# Patient Record
Sex: Male | Born: 1985 | Race: Black or African American | Hispanic: No | Marital: Married | Smoking: Never smoker
Health system: Southern US, Community
[De-identification: ages and names within clinical notes are randomized; demographics above are authoritative.]

## PROBLEM LIST (undated history)

## (undated) DIAGNOSIS — R51 Headache: Secondary | ICD-10-CM

## (undated) DIAGNOSIS — G8929 Other chronic pain: Secondary | ICD-10-CM

## (undated) DIAGNOSIS — R519 Headache, unspecified: Secondary | ICD-10-CM

---
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
## 2004-12-28 ENCOUNTER — Emergency Department (HOSPITAL_COMMUNITY): Admission: EM | Admit: 2004-12-28 | Discharge: 2004-12-28 | Payer: Self-pay | Admitting: Emergency Medicine

## 2005-01-13 ENCOUNTER — Emergency Department (HOSPITAL_COMMUNITY): Admission: EM | Admit: 2005-01-13 | Discharge: 2005-01-13 | Payer: Self-pay | Admitting: Emergency Medicine

## 2005-01-17 ENCOUNTER — Emergency Department (HOSPITAL_COMMUNITY): Admission: EM | Admit: 2005-01-17 | Discharge: 2005-01-17 | Payer: Self-pay | Admitting: Emergency Medicine

## 2005-01-24 ENCOUNTER — Emergency Department: Payer: Self-pay | Admitting: Emergency Medicine

## 2005-02-25 ENCOUNTER — Emergency Department (HOSPITAL_COMMUNITY): Admission: EM | Admit: 2005-02-25 | Discharge: 2005-02-25 | Payer: Self-pay | Admitting: Emergency Medicine

## 2005-07-19 ENCOUNTER — Emergency Department (HOSPITAL_COMMUNITY): Admission: EM | Admit: 2005-07-19 | Discharge: 2005-07-19 | Payer: Self-pay | Admitting: Emergency Medicine

## 2006-01-26 ENCOUNTER — Emergency Department (HOSPITAL_COMMUNITY): Admission: EM | Admit: 2006-01-26 | Discharge: 2006-01-26 | Payer: Self-pay | Admitting: Emergency Medicine

## 2006-03-22 ENCOUNTER — Emergency Department (HOSPITAL_COMMUNITY): Admission: EM | Admit: 2006-03-22 | Discharge: 2006-03-22 | Payer: Self-pay | Admitting: Emergency Medicine

## 2006-03-23 ENCOUNTER — Emergency Department (HOSPITAL_COMMUNITY): Admission: EM | Admit: 2006-03-23 | Discharge: 2006-03-23 | Payer: Self-pay | Admitting: Emergency Medicine

## 2006-08-13 ENCOUNTER — Emergency Department (HOSPITAL_COMMUNITY): Admission: EM | Admit: 2006-08-13 | Discharge: 2006-08-13 | Payer: Self-pay | Admitting: Emergency Medicine

## 2006-11-15 ENCOUNTER — Emergency Department (HOSPITAL_COMMUNITY): Admission: EM | Admit: 2006-11-15 | Discharge: 2006-11-15 | Payer: Self-pay | Admitting: Emergency Medicine

## 2007-05-21 ENCOUNTER — Emergency Department (HOSPITAL_COMMUNITY): Admission: EM | Admit: 2007-05-21 | Discharge: 2007-05-21 | Payer: Self-pay | Admitting: Emergency Medicine

## 2007-05-22 ENCOUNTER — Emergency Department (HOSPITAL_COMMUNITY): Admission: EM | Admit: 2007-05-22 | Discharge: 2007-05-23 | Payer: Self-pay | Admitting: Emergency Medicine

## 2007-05-22 IMAGING — CR DG WRIST COMPLETE 3+V*L*
3 series · 3 of 3 positions shown · non-contrast
Comparison: No prior studies.

CLINICAL DATA: Wrist was crushed between roller.  Pain is radial and anterior.
 LEFT WRIST - 4 VIEW:

[view not recorded (1 of 3)]
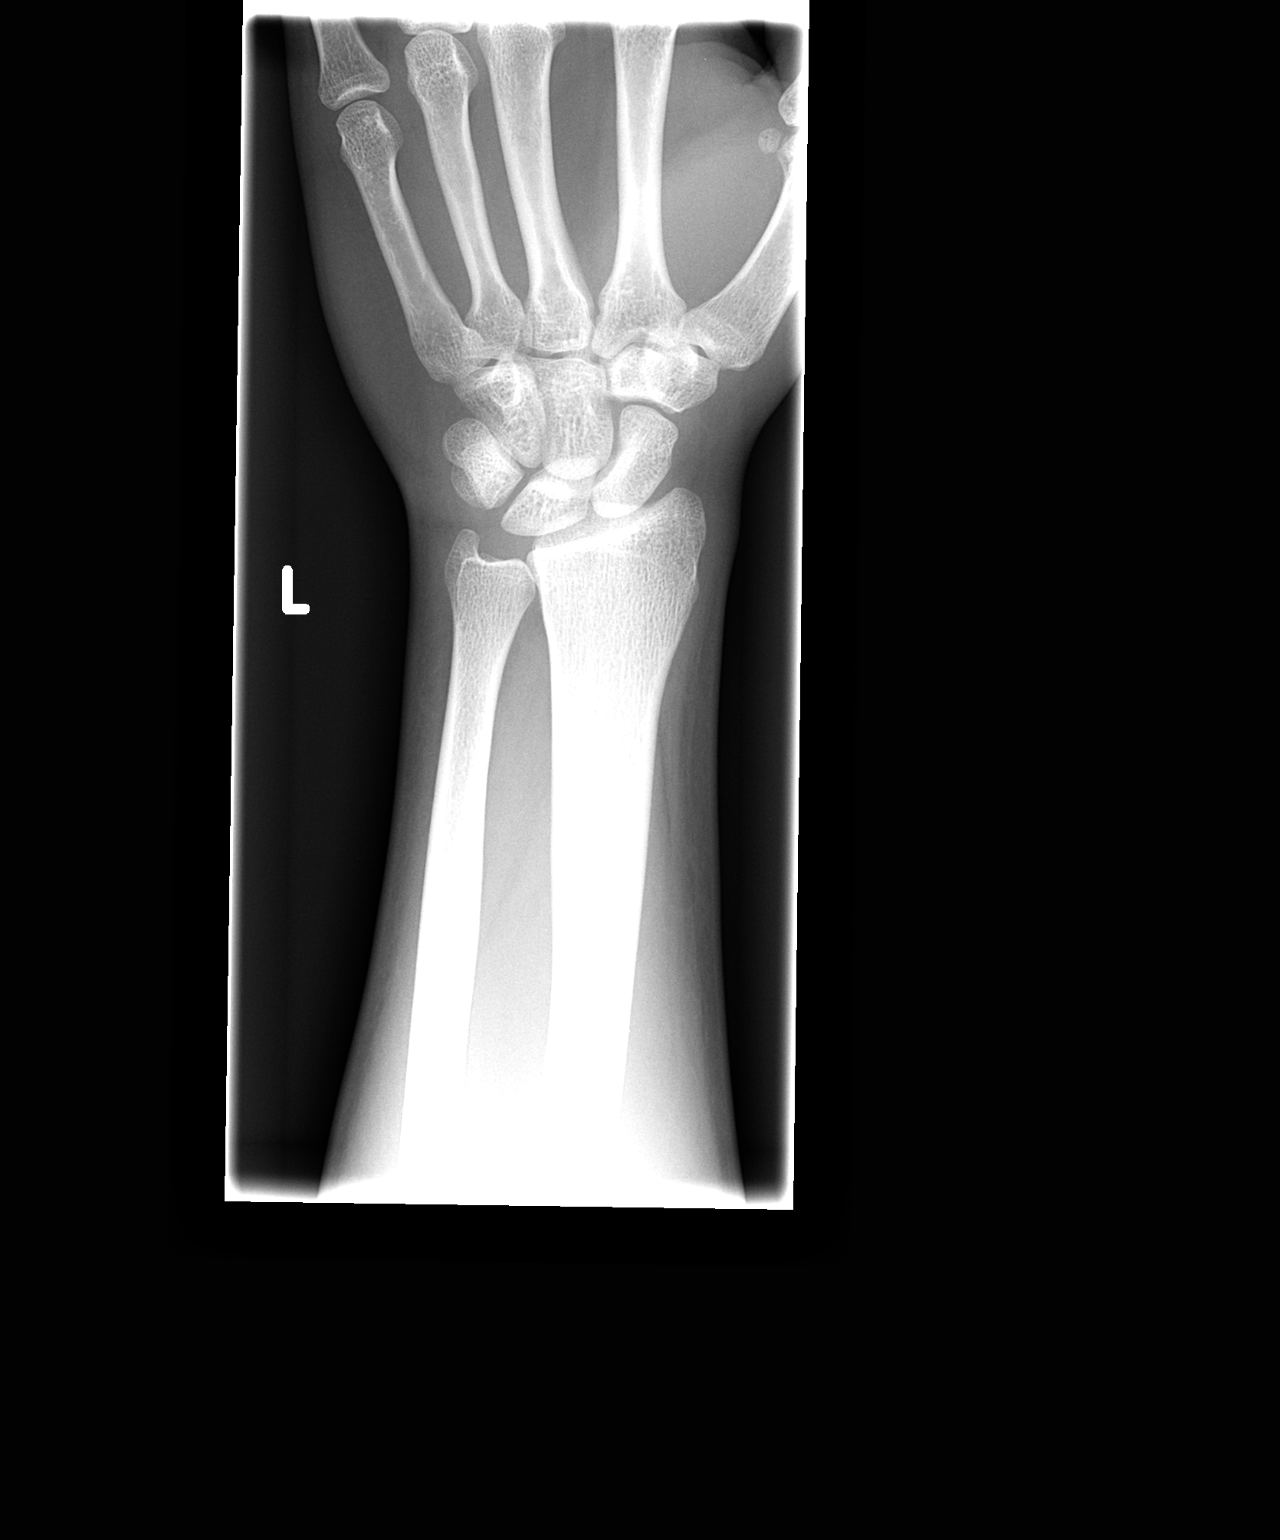

[view not recorded (2 of 3)]
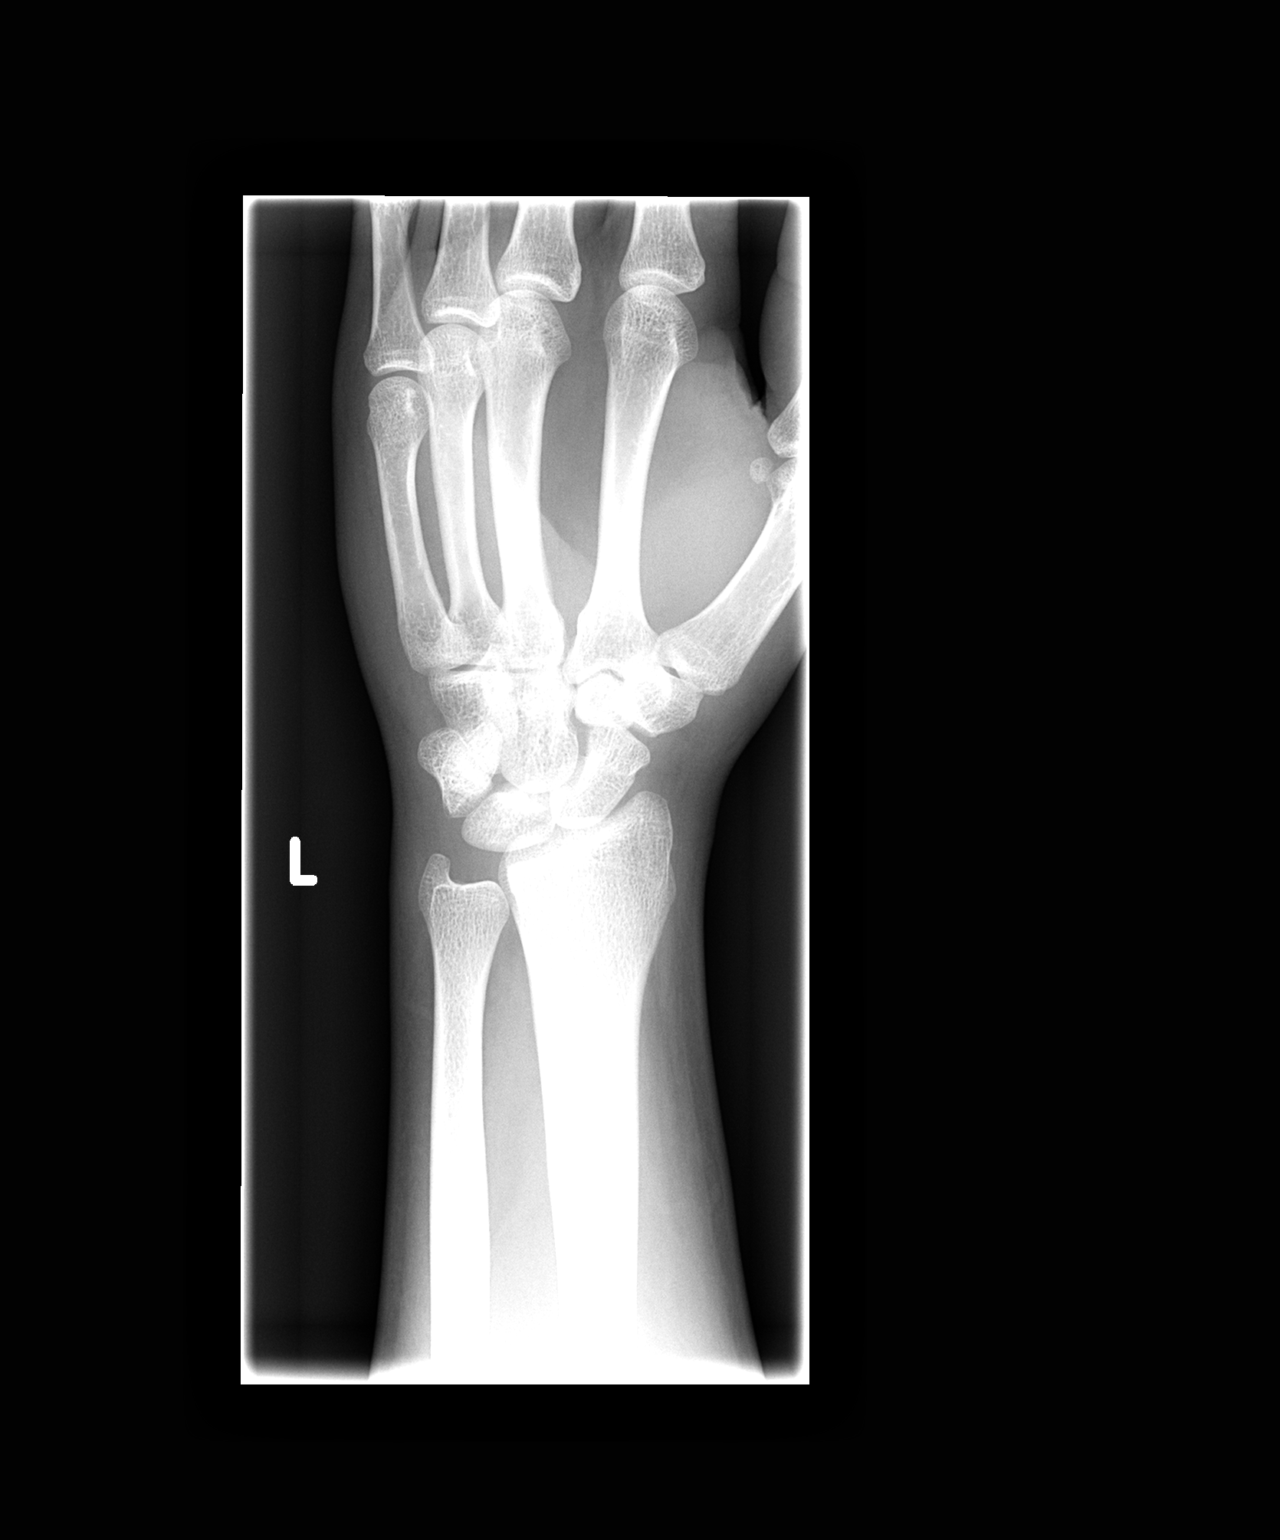

[view not recorded (3 of 3)]
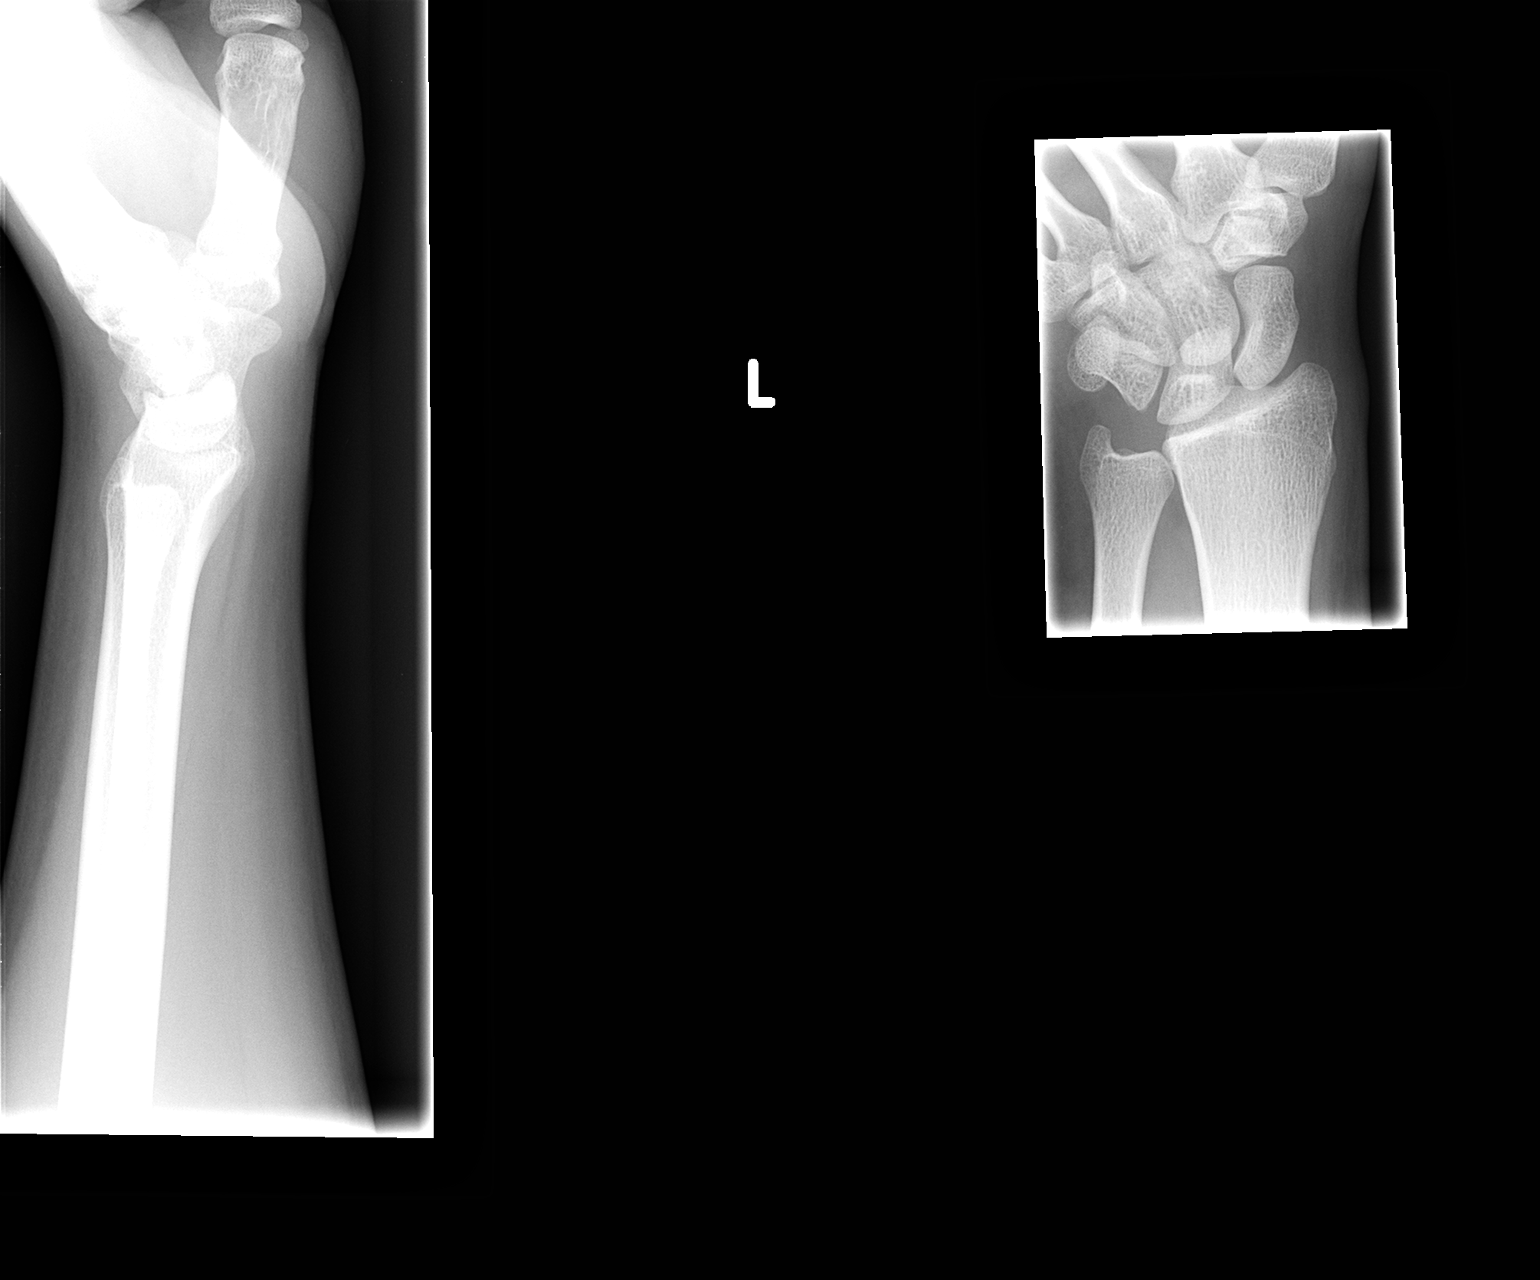

[3 of 3 positions shown; findings below may reference images not displayed]

FINDINGS: There is no evidence of fracture or dislocation.  There is no evidence of arthropathy or other focal bone abnormality.  Soft tissues are unremarkable.
IMPRESSION: Negative.

## 2011-03-12 ENCOUNTER — Encounter: Payer: Self-pay | Admitting: Emergency Medicine

## 2011-03-12 ENCOUNTER — Emergency Department (HOSPITAL_COMMUNITY)
Admission: EM | Admit: 2011-03-12 | Discharge: 2011-03-12 | Disposition: A | Discharge status: Home / Self Care | Payer: Self-pay | Attending: Emergency Medicine | Admitting: Emergency Medicine

## 2011-03-12 DIAGNOSIS — R6889 Other general symptoms and signs: Secondary | ICD-10-CM | POA: Insufficient documentation

## 2011-03-12 DIAGNOSIS — K0889 Other specified disorders of teeth and supporting structures: Secondary | ICD-10-CM

## 2011-03-12 DIAGNOSIS — R220 Localized swelling, mass and lump, head: Secondary | ICD-10-CM | POA: Insufficient documentation

## 2011-03-12 DIAGNOSIS — K089 Disorder of teeth and supporting structures, unspecified: Secondary | ICD-10-CM | POA: Insufficient documentation

## 2011-03-12 DIAGNOSIS — R221 Localized swelling, mass and lump, neck: Secondary | ICD-10-CM | POA: Insufficient documentation

## 2011-03-12 DIAGNOSIS — R51 Headache: Secondary | ICD-10-CM | POA: Insufficient documentation

## 2011-03-12 DIAGNOSIS — J3489 Other specified disorders of nose and nasal sinuses: Secondary | ICD-10-CM | POA: Insufficient documentation

## 2011-03-12 DIAGNOSIS — J0100 Acute maxillary sinusitis, unspecified: Secondary | ICD-10-CM

## 2011-03-12 DIAGNOSIS — R0982 Postnasal drip: Secondary | ICD-10-CM | POA: Insufficient documentation

## 2011-03-12 MED ORDER — ACETAMINOPHEN-CODEINE #3 300-30 MG PO TABS
2.0000 | ORAL_TABLET | Freq: Once | ORAL | Status: AC
  Administered 2011-03-12: 2 via ORAL
  Filled 2011-03-12: qty 2

## 2011-03-12 MED ORDER — AMOXICILLIN-POT CLAVULANATE 500-125 MG PO TABS: 1.0000 | ORAL_TABLET | Freq: Three times a day (TID) | ORAL | Status: AC

## 2011-03-12 MED ORDER — ACETAMINOPHEN-CODEINE #3 300-30 MG PO TABS: 1.0000 | ORAL_TABLET | Freq: Four times a day (QID) | ORAL | Status: AC | PRN

## 2011-03-12 NOTE — ED Notes (Signed)
H/a full sinus dental pain x 12 weeks taking otc meds not helping

## 2011-03-12 NOTE — Discharge Instructions (Signed)
 Dental Pain A tooth ache may be caused by cavities (tooth decay). Cavities expose the nerve of the tooth to air and hot or cold temperatures. It may come from an infection or abscess (also called a boil or furuncle) around your tooth. It is also often caused by dental caries (tooth decay). This causes the pain you are having. DIAGNOSIS  Your caregiver can diagnose this problem by exam. TREATMENT   If caused by an infection, it may be treated with medications which kill germs (antibiotics) and pain medications as prescribed by your caregiver. Take medications as directed.   Only take over-the-counter or prescription medicines for pain, discomfort, or fever as directed by your caregiver.   Whether the tooth ache today is caused by infection or dental disease, you should see your dentist as soon as possible for further care.  SEEK MEDICAL CARE IF: The exam and treatment you received today has been provided on an emergency basis only. This is not a substitute for complete medical or dental care. If your problem worsens or new problems (symptoms) appear, and you are unable to meet with your dentist, call or return to this location. SEEK IMMEDIATE MEDICAL CARE IF:   You have a fever.   You develop redness and swelling of your face, jaw, or neck.   You are unable to open your mouth.   You have severe pain uncontrolled by pain medicine.  MAKE SURE YOU:   Understand these instructions.   Will watch your condition.   Will get help right away if you are not doing well or get worse.  Document Released: 02/19/2005 Document Revised: 11/01/2010 Document Reviewed: 10/08/2007 [facility] Patient Information 2012 [place], [place].   Sinusitis Sinuses are air pockets within the bones of your face. The growth of bacteria within a sinus leads to infection. The infection prevents the sinuses from draining. This infection is called sinusitis. SYMPTOMS  There will be different areas of pain depending on  which sinuses have become infected.  The maxillary sinuses often produce pain beneath the eyes.   Frontal sinusitis may cause pain in the middle of the forehead and above the eyes.  Other problems (symptoms) include:  Toothaches.   Colored, pus-like (purulent) drainage from the nose.   Swelling, warmth, and tenderness over the sinus areas may be signs of infection.  TREATMENT  Sinusitis is most often determined by an exam.X-rays may be taken. If x-rays have been taken, make sure you obtain your results or find out how you are to obtain them. Your caregiver may give you medications (antibiotics). These are medications that will help kill the bacteria causing the infection. You may also be given a medication (decongestant) that helps to reduce sinus swelling.  HOME CARE INSTRUCTIONS   Only take over-the-counter or prescription medicines for pain, discomfort, or fever as directed by your caregiver.   Drink extra fluids. Fluids help thin the mucus so your sinuses can drain more easily.   Applying either moist heat or ice packs to the sinus areas may help relieve discomfort.   Use saline nasal sprays to help moisten your sinuses. The sprays can be found at your local drugstore.  SEEK IMMEDIATE MEDICAL CARE IF:  You have a fever.   You have increasing pain, severe headaches, or toothache.   You have nausea, vomiting, or drowsiness.   You develop unusual swelling around the face or trouble seeing.  MAKE SURE YOU:   Understand these instructions.   Will watch your condition.  Will get help right away if you are not doing well or get worse.  Document Released: 02/19/2005 Document Revised: 11/01/2010 Document Reviewed: 09/18/2006 [facility] Patient Information 2012 [place], [place].

## 2011-03-12 NOTE — ED Provider Notes (Signed)
History     CSN: [id number]  Arrival date & time 03/12/11  1253   First MD Initiated Contact with Patient 03/12/11 1631      Chief Complaint  Patient presents with  . Dental Pain    (Consider location/radiation/quality/duration/timing/severity/associated sxs/prior treatment) HPI Comments: Patient reports that he's had some dental pain in his left upper posterior jaw. This is been off and on for a while. He attributes this to his wisdom teeth. He had his lower wisdom teeth removed while he was in the [employer]. Currently he does not have the finances nor dental insurance to see a dentist. He reports it does hurt to chew or with palpation or pressure August teeth. Patient denies smoking. He also complains of a left-sided facial and head pain that began one to 2 days ago. He is taking Advil with some mild transient relief, however the pain to his coming back. He does have a lot of sinus congestion with discharge from his left nares and drainage in the back of his throat. He denies fevers, chills, cough. He denies rash or stiff neck. No visual problems.  Patient is a 26 y.o. male presenting with tooth pain. The history is provided by the patient.  Dental PainThe primary symptoms include headaches. Primary symptoms do not include fever, shortness of breath, sore throat or cough.  The headache is not associated with weakness.  Additional symptoms do not include: facial swelling and ear pain.    History reviewed. No pertinent past medical history.  History reviewed. No pertinent past surgical history.  No family history on file.  History  Substance Use Topics  . Smoking status: Never Smoker   . Smokeless tobacco: Not on file  . Alcohol Use: Yes      Review of Systems  Constitutional: Negative for fever and chills.  HENT: Positive for congestion, rhinorrhea, sneezing, dental problem, postnasal drip and sinus pressure. Negative for ear pain, sore throat, facial swelling, mouth sores and  neck pain.   Respiratory: Negative for cough, shortness of breath and wheezing.   Neurological: Positive for headaches. Negative for speech difficulty, weakness and numbness.    Allergies  Review of patient's allergies indicates no known allergies.  Home Medications   Current Outpatient Rx  Name Route Sig Dispense Refill  . ALBUTEROL SULFATE HFA 108 (90 BASE) MCG/ACT IN AERS Inhalation Inhale 2 puffs into the lungs every 6 (six) hours as needed. For shortness of breath     . IBUPROFEN 200 MG PO TABS Oral Take 800 mg by mouth every 8 (eight) hours as needed. For pain     . ACETAMINOPHEN-CODEINE #3 300-30 MG PO TABS Oral Take 1-2 tablets by mouth every 6 (six) hours as needed for pain. 24 tablet 0  . AMOXICILLIN-POT CLAVULANATE 500-125 MG PO TABS Oral Take 1 tablet (500 mg total) by mouth every 8 (eight) hours. 30 tablet 0    BP 129/76  Pulse 109  Temp(Src) 98.7 F (37.1 C) (Oral)  Resp 18  SpO2 98%  Physical Exam  Vitals reviewed. Constitutional: He is oriented to person, place, and time. He appears well-developed and well-nourished.  HENT:  Head: Normocephalic.  Right Ear: Hearing and tympanic membrane normal.  Left Ear: Hearing and tympanic membrane normal.  Nose: Mucosal edema and rhinorrhea present. Left sinus exhibits maxillary sinus tenderness.  Mouth/Throat: Uvula is midline, oropharynx is clear and moist and mucous membranes are normal.       purulent discharge in nasopharynx on left  Eyes:  Pupils are equal, round, and reactive to light. No scleral icterus.  Pulmonary/Chest: Effort normal. No respiratory distress.  Lymphadenopathy:    He has no cervical adenopathy.  Neurological: He is alert and oriented to person, place, and time. No cranial nerve deficit.  Skin: Skin is warm and dry. No rash noted.    ED Course  Procedures (including critical care time)  Labs Reviewed - No data to display No results found.   1. Sinusitis, acute maxillary   2. Dentalgia        MDM  Tylenol #3 for analgesia, will put on abx to cover sinusitis.  Pt referred to dentist and told he needs to call and make an appt within 48 hours        Gavin Pound. Oletta Lamas, MD 03/13/11 9147

## 2011-07-01 ENCOUNTER — Other Ambulatory Visit: Payer: Self-pay

## 2011-07-01 ENCOUNTER — Emergency Department (HOSPITAL_COMMUNITY): Payer: Self-pay

## 2011-07-01 ENCOUNTER — Encounter (HOSPITAL_COMMUNITY): Payer: Self-pay

## 2011-07-01 ENCOUNTER — Emergency Department (HOSPITAL_COMMUNITY)
Admission: EM | Admit: 2011-07-01 | Discharge: 2011-07-01 | Disposition: A | Discharge status: Home / Self Care | Payer: Self-pay | Attending: Emergency Medicine | Admitting: Emergency Medicine

## 2011-07-01 DIAGNOSIS — M545 Low back pain, unspecified: Secondary | ICD-10-CM | POA: Insufficient documentation

## 2011-07-01 DIAGNOSIS — M549 Dorsalgia, unspecified: Secondary | ICD-10-CM

## 2011-07-01 DIAGNOSIS — M5417 Radiculopathy, lumbosacral region: Secondary | ICD-10-CM

## 2011-07-01 DIAGNOSIS — J45909 Unspecified asthma, uncomplicated: Secondary | ICD-10-CM | POA: Insufficient documentation

## 2011-07-01 DIAGNOSIS — R209 Unspecified disturbances of skin sensation: Secondary | ICD-10-CM | POA: Insufficient documentation

## 2011-07-01 DIAGNOSIS — R109 Unspecified abdominal pain: Secondary | ICD-10-CM | POA: Insufficient documentation

## 2011-07-01 DIAGNOSIS — IMO0002 Reserved for concepts with insufficient information to code with codable children: Secondary | ICD-10-CM | POA: Insufficient documentation

## 2011-07-01 DIAGNOSIS — R0789 Other chest pain: Secondary | ICD-10-CM | POA: Insufficient documentation

## 2011-07-01 LAB — URINALYSIS, ROUTINE W REFLEX MICROSCOPIC
Bilirubin Urine: NEGATIVE
Glucose, UA: NEGATIVE mg/dL
Hgb urine dipstick: NEGATIVE
Ketones, ur: 15 mg/dL — AB
Leukocytes, UA: NEGATIVE
Nitrite: NEGATIVE
Protein, ur: NEGATIVE mg/dL
Specific Gravity, Urine: 1.018 (ref 1.005–1.030)
Urobilinogen, UA: 0.2 mg/dL (ref 0.0–1.0)
pH: 6 (ref 5.0–8.0)

## 2011-07-01 LAB — DIFFERENTIAL
Basophils Absolute: 0.1 10*3/uL (ref 0.0–0.1)
Basophils Relative: 1 % (ref 0–1)
Eosinophils Absolute: 0.1 10*3/uL (ref 0.0–0.7)
Eosinophils Relative: 1 % (ref 0–5)
Lymphocytes Relative: 30 % (ref 12–46)
Lymphs Abs: 1.7 10*3/uL (ref 0.7–4.0)
Monocytes Absolute: 0.4 10*3/uL (ref 0.1–1.0)
Monocytes Relative: 7 % (ref 3–12)
Neutro Abs: 3.5 10*3/uL (ref 1.7–7.7)
Neutrophils Relative %: 61 % (ref 43–77)

## 2011-07-01 LAB — CBC
HCT: 44.1 % (ref 39.0–52.0)
Hemoglobin: 15.6 g/dL (ref 13.0–17.0)
MCH: 28.7 pg (ref 26.0–34.0)
MCHC: 35.4 g/dL (ref 30.0–36.0)
MCV: 81.2 fL (ref 78.0–100.0)
Platelets: 185 10*3/uL (ref 150–400)
RBC: 5.43 MIL/uL (ref 4.22–5.81)
RDW: 13 % (ref 11.5–15.5)
WBC: 5.6 10*3/uL (ref 4.0–10.5)

## 2011-07-01 LAB — COMPREHENSIVE METABOLIC PANEL
ALT: 7 U/L (ref 0–53)
AST: 17 U/L (ref 0–37)
Albumin: 4.6 g/dL (ref 3.5–5.2)
Alkaline Phosphatase: 80 U/L (ref 39–117)
BUN: 14 mg/dL (ref 6–23)
CO2: 30 mEq/L (ref 19–32)
Calcium: 9.6 mg/dL (ref 8.4–10.5)
Chloride: 103 mEq/L (ref 96–112)
Creatinine, Ser: 1 mg/dL (ref 0.50–1.35)
GFR calc Af Amer: >90 mL/min (ref >90)
GFR calc non Af Amer: >90 mL/min (ref >90)
Glucose, Bld: 74 mg/dL (ref 70–99)
Potassium: 4.2 mEq/L (ref 3.5–5.1)
Sodium: 140 mEq/L (ref 135–145)
Total Bilirubin: 0.7 mg/dL (ref 0.3–1.2)
Total Protein: 8.2 g/dL (ref 6.0–8.3)

## 2011-07-01 LAB — PROTIME-INR
INR: 1.01 (ref 0.00–1.49)
Prothrombin Time: 13.5 seconds (ref 11.6–15.2)

## 2011-07-01 LAB — APTT: aPTT: 30 seconds (ref 24–37)

## 2011-07-01 LAB — POCT I-STAT TROPONIN I

## 2011-07-01 LAB — D-DIMER, QUANTITATIVE (NOT AT ARMC): D-Dimer, Quant: 0.33 ug/mL-FEU (ref 0.00–0.48)

## 2011-07-01 MED ORDER — METHOCARBAMOL 500 MG PO TABS: ORAL_TABLET | ORAL | Status: DC

## 2011-07-01 MED ORDER — DEXAMETHASONE 4 MG PO TABS: ORAL_TABLET | ORAL | Status: DC

## 2011-07-01 MED ORDER — ONDANSETRON HCL 4 MG/2ML IJ SOLN
4.0000 mg | Freq: Once | INTRAMUSCULAR | Status: AC
  Administered 2011-07-01: 4 mg via INTRAVENOUS
  Filled 2011-07-01: qty 2

## 2011-07-01 MED ORDER — DEXAMETHASONE SODIUM PHOSPHATE 10 MG/ML IJ SOLN
10.0000 mg | Freq: Once | INTRAMUSCULAR | Status: AC
  Administered 2011-07-01: 10 mg via INTRAVENOUS
  Filled 2011-07-01: qty 1

## 2011-07-01 MED ORDER — OXYCODONE-ACETAMINOPHEN 5-325 MG PO TABS: ORAL_TABLET | ORAL | Status: DC

## 2011-07-01 MED ORDER — LORAZEPAM 2 MG/ML IJ SOLN
INTRAMUSCULAR | Status: AC
  Administered 2011-07-01: 1 mg
  Filled 2011-07-01: qty 1

## 2011-07-01 MED ORDER — MORPHINE SULFATE 4 MG/ML IJ SOLN
4.0000 mg | Freq: Once | INTRAMUSCULAR | Status: AC
  Administered 2011-07-01: 4 mg via INTRAVENOUS
  Filled 2011-07-01: qty 1

## 2011-07-01 MED ORDER — SODIUM CHLORIDE 0.9 % IV SOLN
INTRAVENOUS | Status: DC
  Administered 2011-07-01: 16:00:00 via INTRAVENOUS

## 2011-07-01 MED ORDER — LORAZEPAM 2 MG/ML IJ SOLN
1.0000 mg | Freq: Once | INTRAMUSCULAR | Status: AC
  Administered 2011-07-01: 1 mg via INTRAVENOUS

## 2011-07-01 NOTE — ED Notes (Signed)
Pt return from MRI. Report received from day nurse.

## 2011-07-01 NOTE — ED Provider Notes (Signed)
History     CSN: [id number]  Arrival date & time 07/01/11  1421   First MD Initiated Contact with Patient 07/01/11 1459      Chief Complaint  Patient presents with  . Abdominal Pain    right side    (Consider location/radiation/quality/duration/timing/severity/associated sxs/prior treatment) HPI  Patient is here with 2 basic complaints. He relates for the past one or 2 months he has been having some pain from the tip of his toes and his right chest and back. He relates last few days has gotten constant. He states he has mild back discomfort however he has a lot of pain with numbness of his right lower extremity. He relates some numbness is on the lateral aspect of his thigh and goes into his great toe. He states laying flat or walking makes the pain worse and he states bending over help [place] make the pain and numbness improved. He denies any involvement of his right upper extremity. He relates today while sitting in church about 1:30 PM he felt like his heart was fluttering and had chest tightness that is different from his asthma. He denies shortness of breath or wheezing. He states this is different from anything he had before. He denies fever, cough, any difficulty with urination including incontinence, he denies pain or swelling in his calves. He has not tried any medications to see if they would help. He denies any prolonged sitting. 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 He denies any known injury.  Patient is concerned that uncle had similar symptoms and died of a blood clot.  PCP none  Past Medical History  Diagnosis Date  . Asthma     History reviewed. No pertinent past surgical history.  No family history on file. Maternal grandfather had congestive heart failure, hypertension, and diabetes Paternal uncle had a MI in his late 22s and suffered from blood clots Mother patient has diabetes  History  Substance Use Topics    . Smoking status: Never Smoker   . Smokeless tobacco: Not on file  . Alcohol Use: rare  Employed lives with spouse  Review of Systems  Allergies  Review of patient's allergies indicates no known allergies.  Home Medications   Current Outpatient Rx  Name Route Sig Dispense Refill  . ALBUTEROL SULFATE HFA 108 (90 BASE) MCG/ACT IN AERS Inhalation Inhale 2 puffs into the lungs every 6 (six) hours as needed. For shortness of breath     . IBUPROFEN 200 MG PO TABS Oral Take 800 mg by mouth every 8 (eight) hours as needed. For pain       BP 111/65  Pulse 64  Temp(Src) 97.5 F (36.4 C) (Oral)  Resp 16  SpO2 100%  Vital signs normal    Physical Exam  Nursing note and vitals reviewed. Constitutional: He is oriented to person, place, and time. He appears well-developed and well-nourished.  Non-toxic appearance. He does not appear ill. He appears distressed.       Patient prefers to stand flexed with his hands on the stretcher. He states that he sits down the numbness in his right leg gets worse.  HENT:  Head: Normocephalic and atraumatic.  Right Ear: External ear normal.  Left Ear: External ear normal.  Nose: Nose normal. No mucosal edema or rhinorrhea.  Mouth/Throat: Oropharynx is clear and moist and mucous membranes are normal. No dental abscesses or uvula swelling.  Eyes: Conjunctivae  and EOM are normal. Pupils are equal, round, and reactive to light.  Neck: Normal range of motion and full passive range of motion without pain. Neck supple.  Cardiovascular: Normal rate, regular rhythm and normal heart sounds.  Exam reveals no gallop and no friction rub.   No murmur heard. Pulmonary/Chest: Effort normal and breath sounds normal. No respiratory distress. He has no wheezes. He has no rhonchi. He has no rales. He exhibits no tenderness and no crepitus.  Abdominal: Soft. Normal appearance and bowel sounds are normal. He exhibits no distension. There is no tenderness. There is no  rebound and no guarding.  Musculoskeletal: Normal range of motion. He exhibits no edema and no tenderness.       Moves all extremities well. Patient does have tenderness to palpation in his mid lumbar spine. His reflexes are 2+ on the left patellar and 1+ on the right patellar, he has pain on range of motion at the waist to the right which makes his numbness in his right leg worse. He also has some numbness to light touch on the lateral aspect of his right lower leg. He does not have pain on straight leg raising  Neurological: He is alert and oriented to person, place, and time. He has normal strength. No cranial nerve deficit.  Skin: Skin is warm, dry and intact. No rash noted. No erythema. No pallor.  Psychiatric: He has a normal mood and affect. His speech is normal and behavior is normal. His mood appears not anxious.    ED Course  Procedures (including critical care time)   Medications  0.9 %  sodium chloride infusion (  Intravenous New Bag/Given 07/01/11 1624)  morphine 4 MG/ML injection 4 mg (4 mg Intravenous Given 07/01/11 1624)  ondansetron (ZOFRAN) injection 4 mg (4 mg Intravenous Given 07/01/11 1624)  LORazepam (ATIVAN) 2 MG/ML injection (1 mg  Given 07/01/11 1845)  LORazepam (ATIVAN) injection 1 mg (1 mg Intravenous Given 07/01/11 1845)  dexamethasone (DECADRON) injection 10 mg (10 mg Intravenous Given 07/01/11 1957)   Pt given results of tests. He denies needing more pain meds before discharge.    Results for orders placed during the hospital encounter of 07/01/11  URINALYSIS, ROUTINE W REFLEX MICROSCOPIC      Component Value Range   Color, Urine YELLOW  YELLOW    APPearance CLEAR  CLEAR    Specific Gravity, Urine 1.018  1.005 - 1.030    pH 6.0  5.0 - 8.0    Glucose, UA NEGATIVE  NEGATIVE (mg/dL)   Hgb urine dipstick NEGATIVE  NEGATIVE    Bilirubin Urine NEGATIVE  NEGATIVE    Ketones, ur 15 (*) NEGATIVE (mg/dL)   Protein, ur NEGATIVE  NEGATIVE (mg/dL)   Urobilinogen, UA 0.2   0.0 - 1.0 (mg/dL)   Nitrite NEGATIVE  NEGATIVE    Leukocytes, UA NEGATIVE  NEGATIVE   CBC      Component Value Range   WBC 5.6  4.0 - 10.5 (K/uL)   RBC 5.43  4.22 - 5.81 (MIL/uL)   Hemoglobin 15.6  13.0 - 17.0 (g/dL)   HCT 16.1  09.6 - 04.5 (%)   MCV 81.2  78.0 - 100.0 (fL)   MCH 28.7  26.0 - 34.0 (pg)   MCHC 35.4  30.0 - 36.0 (g/dL)   RDW 40.9  81.1 - 91.4 (%)   Platelets 185  150 - 400 (K/uL)  DIFFERENTIAL      Component Value Range   Neutrophils Relative 61  43 - 77 (%)   Neutro Abs 3.5  1.7 - 7.7 (K/uL)   Lymphocytes Relative 30  12 - 46 (%)   Lymphs Abs 1.7  0.7 - 4.0 (K/uL)   Monocytes Relative 7  3 - 12 (%)   Monocytes Absolute 0.4  0.1 - 1.0 (K/uL)   Eosinophils Relative 1  0 - 5 (%)   Eosinophils Absolute 0.1  0.0 - 0.7 (K/uL)   Basophils Relative 1  0 - 1 (%)   Basophils Absolute 0.1  0.0 - 0.1 (K/uL)  COMPREHENSIVE METABOLIC PANEL      Component Value Range   Sodium 140  135 - 145 (mEq/L)   Potassium 4.2  3.5 - 5.1 (mEq/L)   Chloride 103  96 - 112 (mEq/L)   CO2 30  19 - 32 (mEq/L)   Glucose, Bld 74  70 - 99 (mg/dL)   BUN 14  6 - 23 (mg/dL)   Creatinine, Ser 1.61  0.50 - 1.35 (mg/dL)   Calcium 9.6  8.4 - 09.6 (mg/dL)   Total Protein 8.2  6.0 - 8.3 (g/dL)   Albumin 4.6  3.5 - 5.2 (g/dL)   AST 17  0 - 37 (U/L)   ALT 7  0 - 53 (U/L)   Alkaline Phosphatase 80  39 - 117 (U/L)   Total Bilirubin 0.7  0.3 - 1.2 (mg/dL)   GFR calc non Af Amer >90  >90 (mL/min)   GFR calc Af Amer >90  >90 (mL/min)  D-DIMER, QUANTITATIVE      Component Value Range   D-Dimer, Quant 0.33  0.00 - 0.48 (ug/mL-FEU)  APTT      Component Value Range   aPTT 30  24 - 37 (seconds)  PROTIME-INR      Component Value Range   Prothrombin Time 13.5  11.6 - 15.2 (seconds)   INR 1.01  0.00 - 1.49   POCT I-STAT TROPONIN I      Component Value Range   Troponin i, poc 0.00  0.00 - 0.08 (ng/mL)   Comment 3             Laboratory interpretation all normal    Mr Lumbar Spine Wo  Contrast  07/01/2011  *RADIOLOGY REPORT*  Clinical Data: Low right back pain for 1 month worsening over last 2 days.  No acute injury or prior relevant surgery.  MRI LUMBAR SPINE WITHOUT CONTRAST  Technique:  Multiplanar and multiecho pulse sequences of the lumbar spine were obtained without intravenous contrast.  Comparison: Acute abdominal series 07/19/2005.  Findings: Study is mildly motion degraded attributed to pain. Prior radiographs demonstrate five lumbar type vertebral bodies. At L5-S1, there is 1.4 cm of anterolisthesis secondary to chronic bilateral L5 pars defects.  There is marked loss of disc height at L5-S1.  The alignment is otherwise normal.  There is no evidence of acute fracture.  The conus medullaris extends to the T12-L1 level and appears normal. There are no paraspinal abnormalities.  There are no significant disc space findings from T11-T12 through L3-L4.  L4-L5:  Disc height and hydration are maintained.  There is mild bilateral facet hypertrophy without foraminal compromise or nerve root encroachment.  L5-S1:  There is severe right and moderate left foraminal stenosis due to the anterolisthesis and diffuse disc bulging.  There is right L5 nerve root encroachment.  In addition, the central canal is moderately narrowed and there is mild left lateral recess stenosis.  IMPRESSION:  1.  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  The associated diffuse disc bulging asymmetric to the right contributes to severe right greater than left foraminal stenosis, moderate central and mild left lateral recess stenosis. Right L5 nerve root encroachment is likely. 2.  No other significant disc space findings.  Original Report Authenticated By: Gerrianne Scale, M.D.   Dg Chest Port 1 View  07/01/2011  *RADIOLOGY REPORT*  Clinical Data: Palpitations.  Chest tightness.  PORTABLE CHEST - 1 VIEW  Comparison: 08/13/2006 and 01/26/2006.  Findings: 1538 hours. The heart size and  mediastinal contours are normal. The lungs are clear. There is no pleural effusion or pneumothorax. No acute osseous findings are identified.  IMPRESSION: No active cardiopulmonary process.  Original Report Authenticated By: Gerrianne Scale, M.D.     Date: 07/01/2011  Rate: 63  Rhythm: normal sinus rhythm  QRS Axis: normal  Intervals: normal  ST/T Wave abnormalities: normal  Conduction Disutrbances:none  Narrative Interpretation:   Old EKG Reviewed: unchanged from 08/13/2006    1. Radiculopathy of lumbosacral region   2. Back pain    New Prescriptions   DEXAMETHASONE (DECADRON) 4 MG TABLET    Take 1 po BID with a meal for 4 days then once a day for 4 days   METHOCARBAMOL (ROBAXIN) 500 MG TABLET    Take 1 or 2 po Q 6hrs for pain   OXYCODONE-ACETAMINOPHEN (ROXICET) 5-325 MG PER TABLET    Take 1 or 2 po Q 6hrs for pain   Plan discharge Refer to Dr Danielle Dess   Devoria Albe, MD, FACEP    MDM          Ward Givens, MD 07/01/11 2013

## 2011-07-01 NOTE — Discharge Instructions (Signed)
You have a bulging disc in your lower back that is pressing on the nerve in your back that goes into your right leg. Take the medications as prescribed. Call Dr Verlee Rossetti office tomorrow to get an appointment to discuss treatment options with him. Return to the ED if you lose control of your bladder or bowels or you seem worse.   Lumbosacral Radiculopathy Lumbosacral radiculopathy is a pinched nerve or nerves in the low back (lumbosacral area). When this happens you may have weakness in your legs and may not be able to stand on your toes. You may have pain going down into your legs. There may be difficulties with walking normally. There are many causes of this problem. Sometimes this may happen from an injury, or simply from arthritis or boney problems. It may also be caused by other illnesses such as diabetes. If there is no improvement after treatment, further studies may be done to find the exact cause. DIAGNOSIS  X-rays may be needed if the problems become long standing. Electromyograms may be done. This study is one in which the working of nerves and muscles is studied. HOME CARE INSTRUCTIONS   Applications of ice packs may be helpful. Ice can be used in a plastic bag with a towel around it to prevent frostbite to skin. This may be used every 2 hours for 20 to 30 minutes, or as needed, while awake, or as directed by your caregiver.   Only take over-the-counter or prescription medicines for pain, discomfort, or fever as directed by your caregiver.   If physical therapy was prescribed, follow your caregiver's directions.  SEEK IMMEDIATE MEDICAL CARE IF:   You have pain not controlled with medications.   You seem to be getting worse rather than better.   You develop increasing weakness in your legs.   You develop loss of bowel or bladder control.   You have difficulty with walking or balance, or develop clumsiness in the use of your legs.   You have a fever.  MAKE SURE YOU:   Understand  these instructions.   Will watch your condition.   Will get help right away if you are not doing well or get worse.  Document Released: 02/19/2005 Document Revised: 02/08/2011 Document Reviewed: 10/10/2007 [facility] Patient Information 2012 [place], [place].

## 2011-07-01 NOTE — ED Notes (Signed)
Pt wc to exit. Pt alert x3 but drowsy related to ativan. Pt's family member to drive home.

## 2011-07-01 NOTE — ED Notes (Signed)
Pt in from home with right side abd pain states pain radiates also c/o palpitations this am denies n/v/d denies sob

## 2011-09-27 ENCOUNTER — Encounter (HOSPITAL_COMMUNITY): Payer: Self-pay | Admitting: *Deleted

## 2011-09-27 DIAGNOSIS — J45909 Unspecified asthma, uncomplicated: Secondary | ICD-10-CM | POA: Insufficient documentation

## 2011-09-27 DIAGNOSIS — J329 Chronic sinusitis, unspecified: Secondary | ICD-10-CM | POA: Insufficient documentation

## 2011-09-27 NOTE — ED Notes (Signed)
Pt c/o sinus congestion and maxillary sinus pain and emesis for several days.  Pt states that he has a hx of sinus headaches.  Presently pt photophobic.  Pt took 2 advil and aleve with no relief.

## 2011-09-28 ENCOUNTER — Emergency Department (HOSPITAL_COMMUNITY)
Admission: EM | Admit: 2011-09-28 | Discharge: 2011-09-28 | Disposition: A | Discharge status: Home / Self Care | Payer: Self-pay | Attending: Emergency Medicine | Admitting: Emergency Medicine

## 2011-09-28 DIAGNOSIS — J329 Chronic sinusitis, unspecified: Secondary | ICD-10-CM

## 2011-09-28 HISTORY — DX: Other chronic pain: G89.29

## 2011-09-28 HISTORY — DX: Headache: R51

## 2011-09-28 HISTORY — DX: Headache, unspecified: R51.9

## 2011-09-28 MED ORDER — PSEUDOEPHEDRINE HCL 60 MG PO TABS: 60.0000 mg | ORAL_TABLET | Freq: Four times a day (QID) | ORAL | Status: AC | PRN

## 2011-09-28 MED ORDER — OXYCODONE-ACETAMINOPHEN 5-325 MG PO TABS
2.0000 | ORAL_TABLET | Freq: Once | ORAL | Status: AC
  Administered 2011-09-28: 2 via ORAL
  Filled 2011-09-28: qty 2

## 2011-09-28 MED ORDER — PSEUDOEPHEDRINE HCL 60 MG PO TABS
60.0000 mg | ORAL_TABLET | Freq: Once | ORAL | Status: AC
  Administered 2011-09-28: 60 mg via ORAL
  Filled 2011-09-28: qty 1

## 2011-09-28 NOTE — ED Provider Notes (Signed)
Medical screening examination/treatment/procedure(s) were performed by non-physician practitioner and as supervising physician I was immediately available for consultation/collaboration.  Ayriana Wix R. Jamillah Camilo, MD 09/28/11 0723 

## 2011-09-28 NOTE — ED Provider Notes (Signed)
History     CSN: [id number]  Arrival date & time 09/27/11  2243   First MD Initiated Contact with Patient 09/28/11 0141      Chief Complaint  Patient presents with  . Headache    (Consider location/radiation/quality/duration/timing/severity/associated sxs/prior treatment) HPI Comments: Patient with sinus congestion, and headache for the past 24 hours.  Has tried numerous over-the-counter medications without any relief  Patient is a 26 y.o. male presenting with headaches. The history is provided by the patient.  Headache  This is a new problem. The current episode started yesterday. The problem occurs constantly. The headache is associated with activity. The quality of the pain is described as throbbing. The pain is at a severity of 6/10. The pain is moderate. Pertinent negatives include no fever.    Past Medical History  Diagnosis Date  . Asthma   . Chronic headaches     r/t sinus infections    History reviewed. No pertinent past surgical history.  No family history on file.  History  Substance Use Topics  . Smoking status: Never Smoker   . Smokeless tobacco: Not on file  . Alcohol Use: No      Review of Systems  Constitutional: Negative for fever.  HENT: Positive for congestion and sinus pressure.   Neurological: Positive for headaches.    Allergies  Review of patient's allergies indicates no known allergies.  Home Medications   Current Outpatient Rx  Name Route Sig Dispense Refill  . ALBUTEROL SULFATE HFA 108 (90 BASE) MCG/ACT IN AERS Inhalation Inhale 2 puffs into the lungs every 6 (six) hours as needed. For shortness of breath     . IBUPROFEN 200 MG PO TABS Oral Take 400 mg by mouth every 6 (six) hours as needed. For pain    . NAPROXEN SODIUM 220 MG PO TABS Oral Take 440 mg by mouth 2 (two) times daily as needed. For pain    . PSEUDOEPHEDRINE HCL 60 MG PO TABS Oral Take 1 tablet (60 mg total) by mouth every 6 (six) hours as needed for congestion. 30  tablet 0    BP 118/67  Pulse 77  Temp 98.3 F (36.8 C) (Oral)  Resp 16  SpO2 97%  Physical Exam  Constitutional: He appears well-developed and well-nourished.  HENT:  Nose: Right sinus exhibits maxillary sinus tenderness and frontal sinus tenderness. Left sinus exhibits maxillary sinus tenderness and frontal sinus tenderness.  Eyes: Pupils are equal, round, and reactive to light.  Neck: Normal range of motion.  Cardiovascular: Normal rate.   Musculoskeletal: Normal range of motion.  Neurological: He is alert.  Skin: Skin is warm.    ED Course  Procedures (including critical care time)  Labs Reviewed - No data to display No results found.   1. Sinusitis       MDM   We'll treat with decongestant, and pain control for sinusitis        Arman Filter, NP 09/28/11 0205  Arman Filter, NP 09/28/11 0205

## 2012-09-30 ENCOUNTER — Emergency Department (HOSPITAL_COMMUNITY)
Admission: EM | Admit: 2012-09-30 | Discharge: 2012-09-30 | Disposition: A | Discharge status: Home / Self Care | Payer: BC Managed Care – PPO | Attending: Emergency Medicine | Admitting: Emergency Medicine

## 2012-09-30 ENCOUNTER — Encounter (HOSPITAL_COMMUNITY): Payer: Self-pay | Admitting: Emergency Medicine

## 2012-09-30 DIAGNOSIS — Z79899 Other long term (current) drug therapy: Secondary | ICD-10-CM | POA: Insufficient documentation

## 2012-09-30 DIAGNOSIS — X58XXXA Exposure to other specified factors, initial encounter: Secondary | ICD-10-CM | POA: Insufficient documentation

## 2012-09-30 DIAGNOSIS — J45909 Unspecified asthma, uncomplicated: Secondary | ICD-10-CM | POA: Insufficient documentation

## 2012-09-30 DIAGNOSIS — Y9389 Activity, other specified: Secondary | ICD-10-CM | POA: Insufficient documentation

## 2012-09-30 DIAGNOSIS — S76219A Strain of adductor muscle, fascia and tendon of unspecified thigh, initial encounter: Secondary | ICD-10-CM

## 2012-09-30 DIAGNOSIS — IMO0002 Reserved for concepts with insufficient information to code with codable children: Secondary | ICD-10-CM | POA: Insufficient documentation

## 2012-09-30 DIAGNOSIS — Y929 Unspecified place or not applicable: Secondary | ICD-10-CM | POA: Insufficient documentation

## 2012-09-30 MED ORDER — IBUPROFEN 400 MG PO TABS
800.0000 mg | ORAL_TABLET | Freq: Once | ORAL | Status: AC
  Administered 2012-09-30: 800 mg via ORAL
  Filled 2012-09-30: qty 2

## 2012-09-30 MED ORDER — NAPROXEN 500 MG PO TABS: 500.0000 mg | ORAL_TABLET | Freq: Two times a day (BID) | ORAL | Status: DC

## 2012-09-30 NOTE — ED Provider Notes (Signed)
CSN: [id number]     Arrival date & time 09/30/12  1916 History    This chart was scribed for Dorthula Matas, non-physician practitioner working with Gilda Crease, * by Leone Payor, ED Scribe. This patient was seen in room TR11C/TR11C and the patient's care was started at 1916.   First MD Initiated Contact with Patient 09/30/12 1922     Chief Complaint  Patient presents with  . Groin Pain    The history is provided by the patient. No language interpreter was used.    HPI Comments: Willie Chavez is a 27 y.o. male who presents to the Emergency Department complaining of constant, unchanged right groin muscle pain starting 3 days ago. Pt denies any injury or trauma to the area. He denies any urinary symptoms. He denies pain to the penis or testicles. The pain is worse with certain movements and radiates slightly upwards Accidentally slipped in car oil today and felt it pull again. Denies taking OTC pain medication at home.   Pt denies smoking and alcohol use.  Past Medical History  Diagnosis Date  . Asthma   . Chronic headaches     r/t sinus infections   History reviewed. No pertinent past surgical history. No family history on file. History  Substance Use Topics  . Smoking status: Never Smoker   . Smokeless tobacco: Not on file  . Alcohol Use: No    Review of Systems  Genitourinary: Negative for penile pain and testicular pain.       Groin pain  All other systems reviewed and are negative.    Allergies  Review of patient's allergies indicates no known allergies.  Home Medications   Current Outpatient Rx  Name  Route  Sig  Dispense  Refill  . albuterol (PROVENTIL HFA;VENTOLIN HFA) 108 (90 BASE) MCG/ACT inhaler   Inhalation   Inhale 2 puffs into the lungs every 6 (six) hours as needed. For shortness of breath          . ibuprofen (ADVIL,MOTRIN) 200 MG tablet   Oral   Take 400 mg by mouth every 6 (six) hours as needed. For pain         . Multiple  Vitamins-Minerals (MULTIVITAMIN WITH MINERALS) tablet   Oral   Take 1 tablet by mouth daily.         . naproxen sodium (ANAPROX) 220 MG tablet   Oral   Take 440 mg by mouth 2 (two) times daily as needed. For pain         . naproxen (NAPROSYN) 500 MG tablet   Oral   Take 1 tablet (500 mg total) by mouth 2 (two) times daily.   30 tablet   0    BP 127/74  Pulse 68  Temp(Src) 98.1 F (36.7 C) (Oral)  Resp 16  SpO2 98% Physical Exam  Nursing note and vitals reviewed. Constitutional: He appears well-developed and well-nourished. No distress.  HENT:  Head: Normocephalic and atraumatic.  Eyes: Pupils are equal, round, and reactive to light.  Neck: Normal range of motion. Neck supple.  Cardiovascular: Normal rate and regular rhythm.   Pulmonary/Chest: Effort normal.  Abdominal: Soft.  Genitourinary: Testes normal and penis normal.  Musculoskeletal:       Left hip: He exhibits decreased range of motion. He exhibits normal strength, no tenderness, no bony tenderness, no swelling and no crepitus.       Legs: Neurological: He is alert.  Skin: Skin is warm and dry.  ED Course   Procedures (including critical care time) DIAGNOSTIC STUDIES: Oxygen Saturation is 98% on RA, normal by my interpretation.    COORDINATION OF CARE: 7:30 PM Discussed treatment plan with pt at bedside and pt agreed to plan.   Labs Reviewed - No data to display No results found. 1. Groin strain, initial encounter     MDM  Muscle strain. Will treat with Naprosyn and rest.  27 y.o.Willie Chavez's evaluation in the Emergency Department is complete. It has been determined that no acute conditions requiring further emergency intervention are present at this time. The patient/guardian have been advised of the diagnosis and plan. We have discussed signs and symptoms that warrant return to the ED, such as changes or worsening in symptoms.  Vital signs are stable at discharge. Filed Vitals:    09/30/12 1918  BP: 127/74  Pulse: 68  Temp: 98.1 F (36.7 C)  Resp: 16    Patient/guardian has voiced understanding and agreed to follow-up with the PCP or specialist.  I personally performed the services described in this documentation, which was scribed in my presence. The recorded information has been reviewed and is accurate.   Dorthula Matas, PA-C 09/30/12 1937

## 2012-09-30 NOTE — ED Notes (Signed)
PT. REPORTS RIGHT GROIN MUSCLE ACHE FOR 2 DAYS , DENIES INJURY OR FALL , NO URINARY DISCOMFORT .

## 2012-10-01 NOTE — ED Provider Notes (Signed)
Medical screening examination/treatment/procedure(s) were performed by non-physician practitioner and as supervising physician I was immediately available for consultation/collaboration.   Kolbie Lepkowski J. Clyde Zarrella, MD 10/01/12 1634 

## 2012-10-03 ENCOUNTER — Encounter (HOSPITAL_COMMUNITY): Payer: Self-pay | Admitting: Adult Health

## 2012-10-03 ENCOUNTER — Emergency Department (HOSPITAL_COMMUNITY)
Admission: EM | Admit: 2012-10-03 | Discharge: 2012-10-03 | Disposition: A | Discharge status: Home / Self Care | Payer: BC Managed Care – PPO | Attending: Emergency Medicine | Admitting: Emergency Medicine

## 2012-10-03 DIAGNOSIS — Z79899 Other long term (current) drug therapy: Secondary | ICD-10-CM | POA: Insufficient documentation

## 2012-10-03 DIAGNOSIS — J45909 Unspecified asthma, uncomplicated: Secondary | ICD-10-CM | POA: Insufficient documentation

## 2012-10-03 DIAGNOSIS — G8929 Other chronic pain: Secondary | ICD-10-CM | POA: Insufficient documentation

## 2012-10-03 DIAGNOSIS — R21 Rash and other nonspecific skin eruption: Secondary | ICD-10-CM | POA: Insufficient documentation

## 2012-10-03 MED ORDER — HYDROCORTISONE 1 % EX CREA: TOPICAL_CREAM | CUTANEOUS | Status: DC

## 2012-10-03 MED ORDER — DIPHENHYDRAMINE HCL 25 MG PO TABS: 25.0000 mg | ORAL_TABLET | Freq: Four times a day (QID) | ORAL | Status: DC

## 2012-10-03 MED ORDER — FAMOTIDINE 20 MG PO TABS: 20.0000 mg | ORAL_TABLET | Freq: Two times a day (BID) | ORAL | Status: DC

## 2012-10-03 MED ORDER — DIPHENHYDRAMINE HCL 25 MG PO CAPS
50.0000 mg | ORAL_CAPSULE | Freq: Once | ORAL | Status: AC
  Administered 2012-10-03: 50 mg via ORAL
  Filled 2012-10-03: qty 2

## 2012-10-03 MED ORDER — FAMOTIDINE 20 MG PO TABS
20.0000 mg | ORAL_TABLET | Freq: Once | ORAL | Status: AC
  Administered 2012-10-03: 20 mg via ORAL
  Filled 2012-10-03: qty 1

## 2012-10-03 NOTE — ED Notes (Signed)
Presents with rash to bilateral arms  Associated with itching. C/o small induration to inner left thigh appoximately size of eraser head. No drainage. Pt reports pain. Induration has been there since Sunday, rash began yesterday. Denies fevers

## 2012-10-03 NOTE — ED Provider Notes (Signed)
CSN: [id number]     Arrival date & time 10/03/12  2150 History  This chart was scribed for non-physician practitioner Rhea Bleacher, PA-C, working with Gilda Crease, by Yevette Edwards, ED Scribe. This patient was seen in room TR05C/TR05C and the patient's care was started at 10:45 PM.   First MD Initiated Contact with Patient 10/03/12 2230     Chief Complaint  Patient presents with  . Rash    The history is provided by the patient. No language interpreter was used.   HPI Comments: Willie Chavez is a 27 y.o. male who presents to the Emergency Department complaining of an induration to his left medial thigh which began on Sunday and a rash to his arms bilaterally which began yesterday. He visited the [facility] ED three days ago for pain to his left leg; the pt reports that the induration began later that evening after he had been discharged.  The pt refers to the induration as a boil, and he states that he "popped" the boil and pus-like discharge drained from it.The pt reports experiencing pain to the site of the induration.  He denies experiencing any fever.  The pt has attempted to treat his symptoms with IBU, but with little resolution.  Last week, the pt took one of his father's embrel doses for psoriasis. He denies any recent tick bites. He also denies any new medications including the naprosyn which he was prescribed. He has recently tried [employer], and he used it all over his body. The onset of this condition is acute. The course is constant. The aggravating factors are none. The alleviating factors are none.    Past Medical History  Diagnosis Date  . Asthma   . Chronic headaches     r/t sinus infections   History reviewed. No pertinent past surgical history. History reviewed. No pertinent family history. History  Substance Use Topics  . Smoking status: Never Smoker   . Smokeless tobacco: Not on file  . Alcohol Use: No    Review of Systems  Constitutional:  Negative for fever.  HENT: Negative for facial swelling and trouble swallowing.   Eyes: Negative for redness.  Respiratory: Negative for shortness of breath, wheezing and stridor.   Cardiovascular: Negative for chest pain.  Gastrointestinal: Negative for nausea and vomiting.  Musculoskeletal: Negative for myalgias.  Skin: Positive for rash.       Induration  Neurological: Negative for light-headedness.  Psychiatric/Behavioral: Negative for confusion.    Allergies  Review of patient's allergies indicates no known allergies.  Home Medications   Current Outpatient Rx  Name  Route  Sig  Dispense  Refill  . albuterol (PROVENTIL HFA;VENTOLIN HFA) 108 (90 BASE) MCG/ACT inhaler   Inhalation   Inhale 2 puffs into the lungs every 6 (six) hours as needed. For shortness of breath          . ibuprofen (ADVIL,MOTRIN) 200 MG tablet   Oral   Take 400 mg by mouth every 6 (six) hours as needed. For pain         . Multiple Vitamins-Minerals (MULTIVITAMIN WITH MINERALS) tablet   Oral   Take 1 tablet by mouth daily.         . naproxen (NAPROSYN) 500 MG tablet   Oral   Take 1 tablet (500 mg total) by mouth 2 (two) times daily.   30 tablet   0   . naproxen sodium (ANAPROX) 220 MG tablet   Oral   Take  440 mg by mouth 2 (two) times daily as needed. For pain          Triage Vitals: BP 130/82  Pulse 71  Temp(Src) 97.9 F (36.6 C) (Oral)  Resp 16  SpO2 98%  Physical Exam  Nursing note and vitals reviewed. Constitutional: He appears well-developed and well-nourished.  HENT:  Head: Normocephalic and atraumatic.  Eyes: Conjunctivae are normal.  Neck: Normal range of motion. Neck supple.  Pulmonary/Chest: No respiratory distress.  Neurological: He is alert.  Skin: Skin is warm and dry.  Patient with itchy papules noted on medial aspects of bilateral arms. No drainage or discharge. No involvement of palms. Mild excoriation.  There is a small papule on the left inner thigh, less  than 1 cm diameter, no surrounding induration or cellulitis, consistent with infected hair follicle. No drainage.  Psychiatric: He has a normal mood and affect.    ED Course   DIAGNOSTIC STUDIES: Oxygen Saturation is 98% on room air, normal by my interpretation.    COORDINATION OF CARE:  10:49 PM- Discussed treatment plan with patient which includes using benadryl, pepcid and a cortisone cream, and the patient agreed to the plan.   Procedures (including critical care time)  Labs Reviewed - No data to display No results found. 1. Rash    Patient seen and examined. Medications ordered.   Vital signs reviewed and are as follows: Filed Vitals:   10/03/12 2155  BP: 130/82  Pulse: 71  Temp: 97.9 F (36.6 C)  Resp: 16   Patient urged to return with worsening symptoms or other concerns. Patient verbalized understanding and agrees with plan.    MDM  Patient with itchy rash, unclear etiology. Patient does have exposure to new soap. Do not suspect drug reaction. No signs of SJS/TEN. No oral or airway involvement. Given itchy nature, suspect allergic etiology. Will treat with Benadryl, Pepcid, hydrocortisone cream for symptomatic relief.  I personally performed the services described in this documentation, which was scribed in my presence. The recorded information has been reviewed and is accurate.    Renne Crigler, PA-C 10/03/12 2328

## 2012-10-03 NOTE — ED Notes (Addendum)
Pt was seen and treated 2 days ago for L leg pain. Same leg as induration on L inner trhigh

## 2012-10-04 NOTE — ED Provider Notes (Signed)
Medical screening examination/treatment/procedure(s) were performed by non-physician practitioner and as supervising physician I was immediately available for consultation/collaboration.   Gilda Crease, MD 10/04/12 [phone]

## 2013-05-03 ENCOUNTER — Encounter (HOSPITAL_COMMUNITY): Payer: Self-pay | Admitting: Emergency Medicine

## 2013-05-03 ENCOUNTER — Emergency Department (HOSPITAL_COMMUNITY)
Admission: EM | Admit: 2013-05-03 | Discharge: 2013-05-03 | Disposition: A | Discharge status: Home / Self Care | Payer: BC Managed Care – PPO | Attending: Emergency Medicine | Admitting: Emergency Medicine

## 2013-05-03 DIAGNOSIS — J45909 Unspecified asthma, uncomplicated: Secondary | ICD-10-CM | POA: Insufficient documentation

## 2013-05-03 DIAGNOSIS — R51 Headache: Secondary | ICD-10-CM | POA: Insufficient documentation

## 2013-05-03 DIAGNOSIS — Z79899 Other long term (current) drug therapy: Secondary | ICD-10-CM | POA: Insufficient documentation

## 2013-05-03 DIAGNOSIS — R369 Urethral discharge, unspecified: Secondary | ICD-10-CM | POA: Insufficient documentation

## 2013-05-03 LAB — URINALYSIS, ROUTINE W REFLEX MICROSCOPIC
Bilirubin Urine: NEGATIVE
GLUCOSE, UA: NEGATIVE mg/dL
Hgb urine dipstick: NEGATIVE
KETONES UR: NEGATIVE mg/dL
LEUKOCYTES UA: NEGATIVE
Nitrite: NEGATIVE
PH: 7.5 (ref 5.0–8.0)
Protein, ur: NEGATIVE mg/dL
Specific Gravity, Urine: 1.02 (ref 1.005–1.030)
Urobilinogen, UA: 1 mg/dL (ref 0.0–1.0)

## 2013-05-03 MED ORDER — LIDOCAINE HCL (PF) 1 % IJ SOLN
INTRAMUSCULAR | Status: AC
  Administered 2013-05-03: 5 mL
  Filled 2013-05-03: qty 5

## 2013-05-03 MED ORDER — AZITHROMYCIN 250 MG PO TABS
1000.0000 mg | ORAL_TABLET | Freq: Once | ORAL | Status: AC
  Administered 2013-05-03: 1000 mg via ORAL
  Filled 2013-05-03: qty 4

## 2013-05-03 MED ORDER — CEFTRIAXONE SODIUM 250 MG IJ SOLR
250.0000 mg | Freq: Once | INTRAMUSCULAR | Status: AC
  Administered 2013-05-03: 250 mg via INTRAMUSCULAR
  Filled 2013-05-03: qty 250

## 2013-05-03 NOTE — ED Notes (Signed)
Pt wanting to leave after IM injection.  Pt stating he needs to go pick up his daughter.  Charge RN notified of pt wanting to leave.  Stated she would come talk to pt.

## 2013-05-03 NOTE — ED Notes (Signed)
Pt reports clear penile discharge for 2 weeks. Denies urinary s/s.

## 2013-05-03 NOTE — Discharge Instructions (Signed)
Sexually Transmitted Disease A sexually transmitted disease (STD) is a disease or infection that may be passed (transmitted) from person to person, usually during sexual activity. This may happen by way of saliva, semen, blood, vaginal mucus, or urine. Common STDs include:   Gonorrhea.   Chlamydia.   Syphilis.   HIV and AIDS.   Genital herpes.   Hepatitis B and C.   Trichomonas.   Human papillomavirus (HPV).   Pubic lice.   Scabies.  Mites.  Bacterial vaginosis. WHAT ARE CAUSES OF STDs? An STD may be caused by bacteria, a virus, or parasites. STDs are often transmitted during sexual activity if one person is infected. However, they may also be transmitted through nonsexual means. STDs may be transmitted after:   Sexual intercourse with an infected person.   Sharing sex toys with an infected person.   Sharing needles with an infected person or using unclean piercing or tattoo needles.  Having intimate contact with the genitals, mouth, or rectal areas of an infected person.   Exposure to infected fluids during birth. WHAT ARE THE SIGNS AND SYMPTOMS OF STDs? Different STDs have different symptoms. Some people may not have any symptoms. If symptoms are present, they may include:   Painful or bloody urination.   Pain in the pelvis, abdomen, vagina, anus, throat, or eyes.   Skin rash, itching, irritation, growths, sores (lesions), ulcerations, or warts in the genital or anal area.  Abnormal vaginal discharge with or without bad odor.   Penile discharge in men.   Fever.   Pain or bleeding during sexual intercourse.   Swollen glands in the groin area.   Yellow skin and eyes (jaundice). This is seen with hepatitis.   Swollen testicles.  Infertility.  Sores and blisters in the mouth. HOW ARE STDs DIAGNOSED? To make a diagnosis, your health care provider may:   Take a medical history.   Perform a physical exam.   Take a sample of any  discharge for examination.  Swab the throat, cervix, opening to the penis, rectum, or vagina for testing.  Test a sample of your first morning urine.   Perform blood tests.   Perform a Pap smear, if this applies.   Perform a colposcopy.   Perform a laparoscopy.  HOW ARE STDs TREATED? Treatment depends on the STD. Some STDs may be treated but not cured.   Chlamydia, gonorrhea, trichomonas, and syphilis can be cured with antibiotics.   Genital herpes, hepatitis, and HIV can be treated, but not cured, with prescribed medicines. The medicines lessen symptoms.   Genital warts from HPV can be treated with medicine or by freezing, burning (electrocautery), or surgery. Warts may come back.   HPV cannot be cured with medicine or surgery. However, abnormal areas may be removed from the cervix, vagina, or vulva.   If your diagnosis is confirmed, your recent sexual partners need treatment. This is true even if they are symptom-free or have a negative culture or evaluation. They should not have sex until their health care providers say it is OK. HOW CAN I REDUCE MY RISK OF GETTING AN STD?  Use latex condoms, dental dams, and water-soluble lubricants during sexual activity. Do not use petroleum jelly or oils.  Get vaccinated for HPV and hepatitis. If you have not received these vaccines in the past, talk to your health care provider about whether one or both might be right for you.   Avoid risky sex practices that can break the skin.  WHAT SHOULD   I DO IF I THINK I HAVE AN STD?  See your health care provider.   Inform all sexual partners. They should be tested and treated for any STDs.  Do not have sex until your health care provider says it is OK. WHEN SHOULD I GET HELP? Seek immediate medical care if:  You develop severe abdominal pain.  You are a man and notice swelling or pain in the testicles.  You are a woman and notice swelling or pain in your vagina. Document  Released: 05/12/2002 Document Revised: 12/10/2012 Document Reviewed: 09/09/2012 [employer] Patient Information 2014 [employer].  

## 2013-05-03 NOTE — ED Notes (Signed)
Pt refused to stay 30 min after IM antibiotics.  Pt states he has a court order to get his daughter home by 7:30pm and her mom will call the police if she is not there.  Explained in detail the risk of pt leaving prior to observation after medication and the possibility of an allergic reaction/anaphalxis.  Pt states, "I will take my chances but I have to leave."  Pt signed AMA form.

## 2013-05-03 NOTE — ED Provider Notes (Signed)
Medical screening examination/treatment/procedure(s) were performed by non-physician practitioner and as supervising physician I was immediately available for consultation/collaboration.   EKG Interpretation None       Juliet RudeNathan R. Rubin PayorPickering, MD 05/03/13 [phone]

## 2013-05-03 NOTE — ED Provider Notes (Signed)
CSN: [id number]     Arrival date & time 05/03/13  1634 History  This chart was scribed for non-physician practitioner, Roxy Horseman, PA-C, working with Juliet Rude. Rubin Payor, MD by Charline Bills, ED Scribe. This patient was seen in room TR09C/TR09C and the patient's care was started at 6:21 PM.   Chief Complaint  Patient presents with  . Penile Discharge   The history is provided by the patient. No language interpreter was used.    HPI Comments: Willie Chavez is a 28 y.o. male who presents to the Emergency Department complaining of constant, gradually worsening clear penile discharge onset 2 weeks ago. Pt states that his girlfriend of 3 years has a recent yeast infection. He reports dry skin to the external surface of the penis. He states it is painful and describes it as a burning sensation. Pt denies dysuria, fever, chills, nausea or vomiting.   Past Medical History  Diagnosis Date  . Asthma   . Chronic headaches     r/t sinus infections   History reviewed. No pertinent past surgical history. No family history on file. History  Substance Use Topics  . Smoking status: Never Smoker   . Smokeless tobacco: Not on file  . Alcohol Use: No    Review of Systems  Constitutional: Negative for fever and chills.  Gastrointestinal: Negative for nausea and vomiting.  Genitourinary: Positive for discharge and penile pain. Negative for dysuria.     Allergies  Review of patient's allergies indicates no known allergies.  Home Medications   Current Outpatient Rx  Name  Route  Sig  Dispense  Refill  . albuterol (PROVENTIL HFA;VENTOLIN HFA) 108 (90 BASE) MCG/ACT inhaler   Inhalation   Inhale 2 puffs into the lungs every 6 (six) hours as needed. For shortness of breath          . ibuprofen (ADVIL,MOTRIN) 200 MG tablet   Oral   Take 400 mg by mouth every 6 (six) hours as needed. For pain          BP 149/106  Pulse 85  Temp(Src) 98.1 F (36.7 C) (Oral)  Resp 16  Wt 220 lb  (99.791 kg)  SpO2 99% Physical Exam  Nursing note and vitals reviewed. Constitutional: He is oriented to person, place, and time. He appears well-developed and well-nourished. No distress.  HENT:  Head: Normocephalic and atraumatic.  Eyes: EOM are normal.  Neck: Neck supple. No tracheal deviation present.  Cardiovascular: Normal rate.   Pulmonary/Chest: Effort normal. No respiratory distress.  Genitourinary:  Uncircumcised male. Mild amount of clear discharge. No masses, lesions, or lumps. No other abnormalities around the penis, testicles or scrotum.  Musculoskeletal: Normal range of motion.  Neurological: He is alert and oriented to person, place, and time.  Skin: Skin is warm and dry.  Psychiatric: He has a normal mood and affect. His behavior is normal.    ED Course  Procedures (including critical care time) DIAGNOSTIC STUDIES: Oxygen Saturation is 99% on RA, normal by my interpretation.    COORDINATION OF CARE:  6:25 PM-Discussed treatment plan which includes UA with pt at bedside and pt agreed to plan.   Results for orders placed during the hospital encounter of 05/03/13  URINALYSIS, ROUTINE W REFLEX MICROSCOPIC      Result Value Ref Range   Color, Urine YELLOW  YELLOW   APPearance CLEAR  CLEAR   Specific Gravity, Urine 1.020  1.005 - 1.030   pH 7.5  5.0 - 8.0  Glucose, UA NEGATIVE  NEGATIVE mg/dL   Hgb urine dipstick NEGATIVE  NEGATIVE   Bilirubin Urine NEGATIVE  NEGATIVE   Ketones, ur NEGATIVE  NEGATIVE mg/dL   Protein, ur NEGATIVE  NEGATIVE mg/dL   Urobilinogen, UA 1.0  0.0 - 1.0 mg/dL   Nitrite NEGATIVE  NEGATIVE   Leukocytes, UA NEGATIVE  NEGATIVE   No results found.    EKG Interpretation None      MDM   Final diagnoses:  Penile discharge    Patient with penile discharge.  Treated in ED with azithro and rocephin.  Discharge to home.  I personally performed the services described in this documentation, which was scribed in my presence. The  recorded information has been reviewed and is accurate.     Roxy Horsemanobert Kye Silverstein, PA-C 05/03/13 1916

## 2013-05-04 LAB — GC/CHLAMYDIA PROBE AMP
CT Probe RNA: NEGATIVE
GC Probe RNA: NEGATIVE

## 2013-05-28 ENCOUNTER — Emergency Department (HOSPITAL_COMMUNITY)
Admission: EM | Admit: 2013-05-28 | Discharge: 2013-05-28 | Disposition: A | Discharge status: Home / Self Care | Payer: BC Managed Care – PPO | Attending: Emergency Medicine | Admitting: Emergency Medicine

## 2013-05-28 ENCOUNTER — Emergency Department (HOSPITAL_COMMUNITY): Payer: BC Managed Care – PPO

## 2013-05-28 ENCOUNTER — Encounter (HOSPITAL_COMMUNITY): Payer: Self-pay | Admitting: Emergency Medicine

## 2013-05-28 DIAGNOSIS — Y92410 Unspecified street and highway as the place of occurrence of the external cause: Secondary | ICD-10-CM | POA: Insufficient documentation

## 2013-05-28 DIAGNOSIS — G8929 Other chronic pain: Secondary | ICD-10-CM | POA: Insufficient documentation

## 2013-05-28 DIAGNOSIS — S82891A Other fracture of right lower leg, initial encounter for closed fracture: Secondary | ICD-10-CM

## 2013-05-28 DIAGNOSIS — S82899A Other fracture of unspecified lower leg, initial encounter for closed fracture: Secondary | ICD-10-CM | POA: Insufficient documentation

## 2013-05-28 DIAGNOSIS — Z79899 Other long term (current) drug therapy: Secondary | ICD-10-CM | POA: Insufficient documentation

## 2013-05-28 DIAGNOSIS — J45909 Unspecified asthma, uncomplicated: Secondary | ICD-10-CM | POA: Insufficient documentation

## 2013-05-28 DIAGNOSIS — Y939 Activity, unspecified: Secondary | ICD-10-CM | POA: Insufficient documentation

## 2013-05-28 MED ORDER — OXYCODONE-ACETAMINOPHEN 5-325 MG PO TABS: 1.0000 | ORAL_TABLET | ORAL | Status: DC | PRN

## 2013-05-28 NOTE — ED Notes (Signed)
Ortho paged. 

## 2013-05-28 NOTE — ED Notes (Signed)
Patient transported to X-ray 

## 2013-05-28 NOTE — Discharge Instructions (Signed)
Leave splint in place and use crutches until follow up with orthopedics. Call Dr. Magdalene PatriciaHandy's office in the morning to schedule appt for next week. Take pain meds as directed, do not drive while taking these.  May take motrin as well, up to 800mg  3x daily. Return to the ED for new concerns.

## 2013-05-28 NOTE — ED Notes (Signed)
R ankle with swelling in comparison to L ankle, purplish bruising noted circumferential to R ankle.
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
## 2013-05-28 NOTE — Progress Notes (Signed)
Orthopedic Tech Progress Note Patient Details:  Dolan AmenJermaine B Matherly 06/13/1985 [id number]  Ortho Devices Type of Ortho Device: Ace wrap;Stirrup splint;Crutches Ortho Device/Splint Location: RLE Ortho Device/Splint Interventions: Ordered;Application   Jennye MoccasinHughes, Maranatha Grossi Craig 05/28/2013, 8:39 PM

## 2013-06-01 ENCOUNTER — Emergency Department (HOSPITAL_COMMUNITY): Payer: Self-pay

## 2013-06-01 ENCOUNTER — Emergency Department (HOSPITAL_COMMUNITY)
Admission: EM | Admit: 2013-06-01 | Discharge: 2013-06-01 | Disposition: A | Discharge status: Home / Self Care | Payer: Self-pay | Attending: Emergency Medicine | Admitting: Emergency Medicine

## 2013-06-01 ENCOUNTER — Encounter (HOSPITAL_COMMUNITY): Payer: Self-pay | Admitting: Emergency Medicine

## 2013-06-01 DIAGNOSIS — R51 Headache: Secondary | ICD-10-CM | POA: Insufficient documentation

## 2013-06-01 DIAGNOSIS — R079 Chest pain, unspecified: Secondary | ICD-10-CM | POA: Insufficient documentation

## 2013-06-01 DIAGNOSIS — Z8781 Personal history of (healed) traumatic fracture: Secondary | ICD-10-CM | POA: Insufficient documentation

## 2013-06-01 DIAGNOSIS — G8929 Other chronic pain: Secondary | ICD-10-CM | POA: Insufficient documentation

## 2013-06-01 DIAGNOSIS — Z79899 Other long term (current) drug therapy: Secondary | ICD-10-CM | POA: Insufficient documentation

## 2013-06-01 DIAGNOSIS — K209 Esophagitis, unspecified without bleeding: Secondary | ICD-10-CM | POA: Insufficient documentation

## 2013-06-01 DIAGNOSIS — R1110 Vomiting, unspecified: Secondary | ICD-10-CM | POA: Insufficient documentation

## 2013-06-01 DIAGNOSIS — J45909 Unspecified asthma, uncomplicated: Secondary | ICD-10-CM | POA: Insufficient documentation

## 2013-06-01 DIAGNOSIS — R519 Headache, unspecified: Secondary | ICD-10-CM

## 2013-06-01 DIAGNOSIS — R42 Dizziness and giddiness: Secondary | ICD-10-CM | POA: Insufficient documentation

## 2013-06-01 LAB — COMPREHENSIVE METABOLIC PANEL
ALBUMIN: 4.3 g/dL (ref 3.5–5.2)
ALT: 19 U/L (ref 0–53)
AST: 21 U/L (ref 0–37)
Alkaline Phosphatase: 80 U/L (ref 39–117)
BUN: 9 mg/dL (ref 6–23)
CO2: 25 mEq/L (ref 19–32)
Calcium: 9.4 mg/dL (ref 8.4–10.5)
Chloride: 103 mEq/L (ref 96–112)
Creatinine, Ser: 0.83 mg/dL (ref 0.50–1.35)
GFR calc non Af Amer: >90 mL/min (ref >90)
GLUCOSE: 100 mg/dL — AB (ref 70–99)
Potassium: 3.8 mEq/L (ref 3.7–5.3)
Sodium: 143 mEq/L (ref 137–147)
TOTAL PROTEIN: 8.2 g/dL (ref 6.0–8.3)
Total Bilirubin: 0.7 mg/dL (ref 0.3–1.2)

## 2013-06-01 LAB — CBC WITH DIFFERENTIAL/PLATELET
BASOS PCT: 1 % (ref 0–1)
Basophils Absolute: 0 10*3/uL (ref 0.0–0.1)
EOS ABS: 0 10*3/uL (ref 0.0–0.7)
Eosinophils Relative: 1 % (ref 0–5)
HEMATOCRIT: 42.8 % (ref 39.0–52.0)
HEMOGLOBIN: 15 g/dL (ref 13.0–17.0)
Lymphocytes Relative: 17 % (ref 12–46)
Lymphs Abs: 1.5 10*3/uL (ref 0.7–4.0)
MCH: 28.9 pg (ref 26.0–34.0)
MCHC: 35 g/dL (ref 30.0–36.0)
MCV: 82.5 fL (ref 78.0–100.0)
MONO ABS: 0.5 10*3/uL (ref 0.1–1.0)
MONOS PCT: 6 % (ref 3–12)
Neutro Abs: 6.8 10*3/uL (ref 1.7–7.7)
Neutrophils Relative %: 77 % (ref 43–77)
Platelets: 225 10*3/uL (ref 150–400)
RBC: 5.19 MIL/uL (ref 4.22–5.81)
RDW: 13.2 % (ref 11.5–15.5)
WBC: 8.9 10*3/uL (ref 4.0–10.5)

## 2013-06-01 LAB — LIPASE, BLOOD: Lipase: 13 U/L (ref 11–59)

## 2013-06-01 MED ORDER — GI COCKTAIL ~~LOC~~
30.0000 mL | Freq: Once | ORAL | Status: AC
  Administered 2013-06-01: 30 mL via ORAL
  Filled 2013-06-01: qty 30

## 2013-06-01 MED ORDER — PROMETHAZINE HCL 25 MG PO TABS: 25.0000 mg | ORAL_TABLET | Freq: Four times a day (QID) | ORAL | Status: DC | PRN

## 2013-06-01 MED ORDER — DEXAMETHASONE SODIUM PHOSPHATE 10 MG/ML IJ SOLN
10.0000 mg | Freq: Once | INTRAMUSCULAR | Status: AC
  Administered 2013-06-01: 10 mg via INTRAVENOUS
  Filled 2013-06-01: qty 1

## 2013-06-01 MED ORDER — OMEPRAZOLE 20 MG PO CPDR: 20.0000 mg | DELAYED_RELEASE_CAPSULE | Freq: Every day | ORAL | Status: DC

## 2013-06-01 MED ORDER — PANTOPRAZOLE SODIUM 40 MG IV SOLR
40.0000 mg | Freq: Once | INTRAVENOUS | Status: AC
  Administered 2013-06-01: 40 mg via INTRAVENOUS
  Filled 2013-06-01: qty 40

## 2013-06-01 MED ORDER — METOCLOPRAMIDE HCL 5 MG/ML IJ SOLN
10.0000 mg | Freq: Once | INTRAMUSCULAR | Status: AC
  Administered 2013-06-01: 10 mg via INTRAVENOUS
  Filled 2013-06-01: qty 2

## 2013-06-01 MED ORDER — ACETAMINOPHEN 500 MG PO TABS
1000.0000 mg | ORAL_TABLET | Freq: Once | ORAL | Status: AC
  Administered 2013-06-01: 1000 mg via ORAL
  Filled 2013-06-01: qty 2

## 2013-06-01 MED ORDER — DIPHENHYDRAMINE HCL 50 MG/ML IJ SOLN
25.0000 mg | Freq: Once | INTRAMUSCULAR | Status: AC
  Administered 2013-06-01: 25 mg via INTRAVENOUS
  Filled 2013-06-01: qty 1

## 2013-06-01 MED ORDER — SODIUM CHLORIDE 0.9 % IV BOLUS (SEPSIS)
1000.0000 mL | Freq: Once | INTRAVENOUS | Status: AC
  Administered 2013-06-01: 1000 mL via INTRAVENOUS

## 2013-06-01 MED ORDER — SUCRALFATE 1 G PO TABS: 1.0000 g | ORAL_TABLET | Freq: Three times a day (TID) | ORAL | Status: DC

## 2013-06-01 NOTE — ED Notes (Signed)
Per pt sts he has been taking pain meds for his foot that he has been having HA and burning in stomach and burping. sts he was given pain meds here. sts the HA are intermittent.

## 2013-06-01 NOTE — Discharge Instructions (Signed)
Continue percocet as prescribed for severe pain.   Do not drive within four hours of taking this medication (may cause drowsiness or confusion).  Take prilosec once daily and carafate up to four times a day as needed for chest/upper abdominal pain. You can also try tums/rolaids. Avoid alcohol, smoking, caffeine and anti-inflammatory medications such as ibuprofen, motrin, advil, aleve and aspirin.  Take phenergan as needed for nausea and vomiting.  You can take it with benadryl to treat headache. You should return to the ER if you develop fever, worsening pain or uncontrolled vomiting.
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
## 2013-06-02 NOTE — ED Provider Notes (Signed)
Medical screening examination/treatment/procedure(s) were performed by non-physician practitioner and as supervising physician I was immediately available for consultation/collaboration.   EKG Interpretation None        Corde Antonini David Mikylah Ackroyd III, MD 06/02/13 0017 

## 2013-06-02 NOTE — ED Provider Notes (Signed)
Medical screening examination/treatment/procedure(s) were performed by non-physician practitioner and as supervising physician I was immediately available for consultation/collaboration.   EKG Interpretation None        Candyce ChurnJohn David Aziah Brostrom III, MD 06/02/13 1250

## 2014-04-21 ENCOUNTER — Emergency Department (HOSPITAL_COMMUNITY): Payer: Self-pay

## 2014-04-21 ENCOUNTER — Emergency Department (HOSPITAL_COMMUNITY)
Admission: EM | Admit: 2014-04-21 | Discharge: 2014-04-21 | Disposition: A | Discharge status: Home / Self Care | Payer: Self-pay | Attending: Emergency Medicine | Admitting: Emergency Medicine

## 2014-04-21 ENCOUNTER — Encounter (HOSPITAL_COMMUNITY): Payer: Self-pay | Admitting: Emergency Medicine

## 2014-04-21 DIAGNOSIS — R51 Headache: Secondary | ICD-10-CM

## 2014-04-21 DIAGNOSIS — R05 Cough: Secondary | ICD-10-CM

## 2014-04-21 DIAGNOSIS — J45909 Unspecified asthma, uncomplicated: Secondary | ICD-10-CM | POA: Insufficient documentation

## 2014-04-21 DIAGNOSIS — R519 Headache, unspecified: Secondary | ICD-10-CM

## 2014-04-21 DIAGNOSIS — J069 Acute upper respiratory infection, unspecified: Secondary | ICD-10-CM | POA: Insufficient documentation

## 2014-04-21 DIAGNOSIS — Z79899 Other long term (current) drug therapy: Secondary | ICD-10-CM | POA: Insufficient documentation

## 2014-04-21 DIAGNOSIS — R059 Cough, unspecified: Secondary | ICD-10-CM

## 2014-04-21 DIAGNOSIS — G8929 Other chronic pain: Secondary | ICD-10-CM | POA: Insufficient documentation

## 2014-04-21 LAB — CBC
HCT: 41.4 % (ref 39.0–52.0)
Hemoglobin: 13.9 g/dL (ref 13.0–17.0)
MCH: 27.5 pg (ref 26.0–34.0)
MCHC: 33.6 g/dL (ref 30.0–36.0)
MCV: 82 fL (ref 78.0–100.0)
PLATELETS: 199 10*3/uL (ref 150–400)
RBC: 5.05 MIL/uL (ref 4.22–5.81)
RDW: 13.3 % (ref 11.5–15.5)
WBC: 7.9 10*3/uL (ref 4.0–10.5)

## 2014-04-21 LAB — COMPREHENSIVE METABOLIC PANEL
ALT: 14 U/L (ref 0–53)
AST: 21 U/L (ref 0–37)
Albumin: 4 g/dL (ref 3.5–5.2)
Alkaline Phosphatase: 74 U/L (ref 39–117)
Anion gap: 9 (ref 5–15)
BUN: 9 mg/dL (ref 6–23)
CALCIUM: 9.1 mg/dL (ref 8.4–10.5)
CO2: 23 mmol/L (ref 19–32)
CREATININE: 1.11 mg/dL (ref 0.50–1.35)
Chloride: 109 mmol/L (ref 96–112)
GFR calc non Af Amer: 88 mL/min — ABNORMAL LOW (ref >90)
GLUCOSE: 123 mg/dL — AB (ref 70–99)
Potassium: 3.8 mmol/L (ref 3.5–5.1)
SODIUM: 141 mmol/L (ref 135–145)
TOTAL PROTEIN: 7.4 g/dL (ref 6.0–8.3)
Total Bilirubin: 0.6 mg/dL (ref 0.3–1.2)

## 2014-04-21 LAB — URINALYSIS, ROUTINE W REFLEX MICROSCOPIC
BILIRUBIN URINE: NEGATIVE
GLUCOSE, UA: NEGATIVE mg/dL
Hgb urine dipstick: NEGATIVE
KETONES UR: NEGATIVE mg/dL
LEUKOCYTES UA: NEGATIVE
Nitrite: NEGATIVE
PH: 5 (ref 5.0–8.0)
Protein, ur: NEGATIVE mg/dL
Specific Gravity, Urine: 1.013 (ref 1.005–1.030)
Urobilinogen, UA: 0.2 mg/dL (ref 0.0–1.0)

## 2014-04-21 MED ORDER — ONDANSETRON HCL 4 MG/2ML IJ SOLN
4.0000 mg | Freq: Once | INTRAMUSCULAR | Status: AC
  Administered 2014-04-21: 4 mg via INTRAVENOUS
  Filled 2014-04-21: qty 2

## 2014-04-21 MED ORDER — ACETAMINOPHEN 325 MG PO TABS
650.0000 mg | ORAL_TABLET | Freq: Once | ORAL | Status: AC
  Administered 2014-04-21: 650 mg via ORAL
  Filled 2014-04-21: qty 2

## 2014-04-21 MED ORDER — SODIUM CHLORIDE 0.9 % IV BOLUS (SEPSIS)
1000.0000 mL | Freq: Once | INTRAVENOUS | Status: AC
  Administered 2014-04-21: 1000 mL via INTRAVENOUS

## 2014-04-21 MED ORDER — MORPHINE SULFATE 4 MG/ML IJ SOLN
4.0000 mg | Freq: Once | INTRAMUSCULAR | Status: AC
  Administered 2014-04-21: 4 mg via INTRAVENOUS
  Filled 2014-04-21: qty 1
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
## 2014-04-21 NOTE — ED Provider Notes (Signed)
CSN: [id number]     Arrival date & time 04/21/14  1806 History   First MD Initiated Contact with Patient 04/21/14 1920     Chief Complaint  Patient presents with  . Headache  . Fever     (Consider location/radiation/quality/duration/timing/severity/associated sxs/prior Treatment) Patient is a 29 y.o. male presenting with headaches and fever. The history is provided by the patient.  Headache Associated symptoms: cough and fever   Associated symptoms: no abdominal pain, no back pain, no diarrhea, no eye pain, no neck pain, no neck stiffness, no numbness, no sore throat, no vomiting and no weakness   Fever Associated symptoms: cough and headaches   Associated symptoms: no chest pain, no confusion, no diarrhea, no dysuria, no rash, no sore throat and no vomiting   pt c/o 2 day hx cough, non productive, episodic, and 1 day of fevers. No chills/sweats. Intermittent frontal headache c/w prior headaches. Gradual onset. No sore throat. Mild nasal congestion, no severe sinus pain or pressure. No ear pain. Denies neck pain or stiffness. No cp or sob. No abd pain. No nvd. No rash. No skin changes, pain or cellulitis. States family member w recent 'cold'.        Past Medical History  Diagnosis Date  . Asthma   . Chronic headaches     r/t sinus infections   History reviewed. No pertinent past surgical history. History reviewed. No pertinent family history. History  Substance Use Topics  . Smoking status: Never Smoker   . Smokeless tobacco: Not on file  . Alcohol Use: No    Review of Systems  Constitutional: Positive for fever.  HENT: Negative for sore throat.   Eyes: Negative for pain, discharge and redness.  Respiratory: Positive for cough. Negative for shortness of breath.   Cardiovascular: Negative for chest pain and leg swelling.  Gastrointestinal: Negative for vomiting, abdominal pain and diarrhea.  Endocrine: Negative for polyuria.  Genitourinary: Negative for dysuria and flank  pain.  Musculoskeletal: Negative for back pain, neck pain and neck stiffness.  Skin: Negative for rash.  Neurological: Positive for headaches. Negative for syncope, weakness and numbness.  Hematological: Does not bruise/bleed easily.  Psychiatric/Behavioral: Negative for confusion.      Allergies  Review of patient's allergies indicates no known allergies.  Home Medications   Prior to Admission medications   Medication Sig Start Date End Date Taking? Authorizing Provider  acetaminophen (TYLENOL) 500 MG tablet Take 1,000 mg by mouth every 6 (six) hours as needed for moderate pain (pain).   Yes Historical Provider, MD  omeprazole (PRILOSEC) 20 MG capsule Take 1 capsule (20 mg total) by mouth daily. Patient taking differently: Take 20 mg by mouth daily as needed (heartburn).  06/01/13   Arie Sabinaatherine E Schinlever, PA-C  oxyCODONE-acetaminophen (PERCOCET/ROXICET) 5-325 MG per tablet Take 1 tablet by mouth every 4 (four) hours as needed. Patient not taking: Reported on 04/21/2014 05/28/13   Garlon HatchetLisa M Sanders, PA-C  promethazine (PHENERGAN) 25 MG tablet Take 1 tablet (25 mg total) by mouth every 6 (six) hours as needed for nausea or vomiting. Patient not taking: Reported on 04/21/2014 06/01/13   Arie Sabinaatherine E Schinlever, PA-C  sucralfate (CARAFATE) 1 G tablet Take 1 tablet (1 g total) by mouth 4 (four) times daily -  with meals and at bedtime. Patient not taking: Reported on 04/21/2014 06/01/13   Arie Sabinaatherine E Schinlever, PA-C   BP 123/70 mmHg  Pulse 84  Temp(Src) 101.8 F (38.8 C) (Oral)  Resp 16  Ht 6'  3" (1.905 m)  Wt 230 lb (104.327 kg)  BMI 28.75 kg/m2  SpO2 96% Physical Exam  Constitutional: He is oriented to person, place, and time. He appears well-developed and well-nourished. No distress.  febrile  HENT:  Head: Atraumatic.  Mouth/Throat: Oropharynx is clear and moist.  Mild nasal congestion. No sinus tenderness. No temporal tenderness.   Eyes: Conjunctivae and EOM are normal. Pupils are  equal, round, and reactive to light. No scleral icterus.  Neck: Normal range of motion. Neck supple. No tracheal deviation present.  No stiffness or rigidity. Pt moves neck freely, is able to look up to ceiling and move chin down to chest.   Cardiovascular: Normal rate, regular rhythm, normal heart sounds and intact distal pulses.   Pulmonary/Chest: Effort normal. No accessory muscle usage. No respiratory distress.  Non prod cough  Abdominal: Soft. Bowel sounds are normal. He exhibits no distension and no mass. There is no tenderness. There is no rebound and no guarding.  Genitourinary:  No cva tenderness  Musculoskeletal: Normal range of motion. He exhibits no edema or tenderness.  Lymphadenopathy:    He has no cervical adenopathy.  Neurological: He is alert and oriented to person, place, and time. No cranial nerve deficit.  Motor intact bil, stre 5/5. sens grossly intact.   Skin: Skin is warm and dry. No rash noted.  No rash, no petechia.   Psychiatric: He has a normal mood and affect.  Nursing note and vitals reviewed.   ED Course  Procedures (including critical care time) Labs Review  Results for orders placed or performed during the hospital encounter of 04/21/14  CBC  Result Value Ref Range   WBC 7.9 4.0 - 10.5 K/uL   RBC 5.05 4.22 - 5.81 MIL/uL   Hemoglobin 13.9 13.0 - 17.0 g/dL   HCT 47.8 29.5 - 62.1 %   MCV 82.0 78.0 - 100.0 fL   MCH 27.5 26.0 - 34.0 pg   MCHC 33.6 30.0 - 36.0 g/dL   RDW 30.8 65.7 - 84.6 %   Platelets 199 150 - 400 K/uL  Comprehensive metabolic panel  Result Value Ref Range   Sodium 141 135 - 145 mmol/L   Potassium 3.8 3.5 - 5.1 mmol/L   Chloride 109 96 - 112 mmol/L   CO2 23 19 - 32 mmol/L   Glucose, Bld 123 (H) 70 - 99 mg/dL   BUN 9 6 - 23 mg/dL   Creatinine, Ser 9.62 0.50 - 1.35 mg/dL   Calcium 9.1 8.4 - 95.2 mg/dL   Total Protein 7.4 6.0 - 8.3 g/dL   Albumin 4.0 3.5 - 5.2 g/dL   AST 21 0 - 37 U/L   ALT 14 0 - 53 U/L   Alkaline Phosphatase  74 39 - 117 U/L   Total Bilirubin 0.6 0.3 - 1.2 mg/dL   GFR calc non Af Amer 88 (L) >90 mL/min   GFR calc Af Amer >90 >90 mL/min   Anion gap 9 5 - 15  Urinalysis, Routine w reflex microscopic  Result Value Ref Range   Color, Urine YELLOW YELLOW   APPearance CLEAR CLEAR   Specific Gravity, Urine 1.013 1.005 - 1.030   pH 5.0 5.0 - 8.0   Glucose, UA NEGATIVE NEGATIVE mg/dL   Hgb urine dipstick NEGATIVE NEGATIVE   Bilirubin Urine NEGATIVE NEGATIVE   Ketones, ur NEGATIVE NEGATIVE mg/dL   Protein, ur NEGATIVE NEGATIVE mg/dL   Urobilinogen, UA 0.2 0.0 - 1.0 mg/dL   Nitrite NEGATIVE NEGATIVE  Leukocytes, UA NEGATIVE NEGATIVE   Dg Chest 2 View  04/21/2014   CLINICAL DATA:  Cough for the past 2 days. High fever starting today. Headache.  EXAM: CHEST  2 VIEW  COMPARISON:  06/01/2013.  FINDINGS: Normal sized heart. Clear lungs. Mild central peribronchial thickening without significant change. Normal appearing bones.  IMPRESSION: Stable mild bronchitic changes.   Electronically Signed   By: Beckie Salts M.D.   On: 04/21/2014 20:21       MDM   Iv ns. Labs. Po fluids. Cxr.  Reviewed nursing notes and prior charts for additional history.   Headache completely resolved.  No neck pain or stiffness.   Symptoms and exam most c/w viral uri.  Pt currently stable for d/c.    Suzi Roots, MD 04/21/14 2122

## 2014-04-21 NOTE — ED Notes (Signed)
Pt alert and oriented x4. Respirations even and unlabored, bilateral symmetrical rise and fall of chest. Skin warm and dry. In no acute distress. Denies needs.   
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
## 2014-04-21 NOTE — Discharge Instructions (Signed)
It was our pleasure to provide your ER care today - we hope that you feel better.  Rest. Drink plenty of fluids.  Take tylenol/motrin as need.  Follow up with primary care doctor in 3-4 days if symptoms fail to improve/resolve.  Return to ER if worse, new symptoms, trouble breathing, persistent vomiting, severe head pain, other concern.  You were given pain medication in the ER  - no driving for the next 4 hours.      Upper Respiratory Infection, Adult An upper respiratory infection (URI) is also sometimes known as the common cold. The upper respiratory tract includes the nose, sinuses, throat, trachea, and bronchi. Bronchi are the airways leading to the lungs. Most people improve within 1 week, but symptoms can last up to 2 weeks. A residual cough may last even longer.  CAUSES Many different viruses can infect the tissues lining the upper respiratory tract. The tissues become irritated and inflamed and often become very moist. Mucus production is also common. A cold is contagious. You can easily spread the virus to others by oral contact. This includes kissing, sharing a glass, coughing, or sneezing. Touching your mouth or nose and then touching a surface, which is then touched by another person, can also spread the virus. SYMPTOMS  Symptoms typically develop 1 to 3 days after you come in contact with a cold virus. Symptoms vary from person to person. They may include:  Runny nose.  Sneezing.  Nasal congestion.  Sinus irritation.  Sore throat.  Loss of voice (laryngitis).  Cough.  Fatigue.  Muscle aches.  Loss of appetite.  Headache.  Low-grade fever. DIAGNOSIS  You might diagnose your own cold based on familiar symptoms, since most people get a cold 2 to 3 times a year. Your caregiver can confirm this based on your exam. Most importantly, your caregiver can check that your symptoms are not due to another disease such as strep throat, sinusitis, pneumonia, asthma, or  epiglottitis. Blood tests, throat tests, and X-rays are not necessary to diagnose a common cold, but they may sometimes be helpful in excluding other more serious diseases. Your caregiver will decide if any further tests are required. RISKS AND COMPLICATIONS  You may be at risk for a more severe case of the common cold if you smoke cigarettes, have chronic heart disease (such as heart failure) or lung disease (such as asthma), or if you have a weakened immune system. The very young and very old are also at risk for more serious infections. Bacterial sinusitis, middle ear infections, and bacterial pneumonia can complicate the common cold. The common cold can worsen asthma and chronic obstructive pulmonary disease (COPD). Sometimes, these complications can require emergency medical care and may be life-threatening. PREVENTION  The best way to protect against getting a cold is to practice good hygiene. Avoid oral or hand contact with people with cold symptoms. Wash your hands often if contact occurs. There is no clear evidence that vitamin C, vitamin E, echinacea, or exercise reduces the chance of developing a cold. However, it is always recommended to get plenty of rest and practice good nutrition. TREATMENT  Treatment is directed at relieving symptoms. There is no cure. Antibiotics are not effective, because the infection is caused by a virus, not by bacteria. Treatment may include:  Increased fluid intake. Sports drinks offer valuable electrolytes, sugars, and fluids.  Breathing heated mist or steam (vaporizer or shower).  Eating chicken soup or other clear broths, and maintaining good nutrition.  Getting  plenty of rest.  Using gargles or lozenges for comfort.  Controlling fevers with ibuprofen or acetaminophen as directed by your caregiver.  Increasing usage of your inhaler if you have asthma. Zinc gel and zinc lozenges, taken in the first 24 hours of the common cold, can shorten the duration  and lessen the severity of symptoms. Pain medicines may help with fever, muscle aches, and throat pain. A variety of non-prescription medicines are available to treat congestion and runny nose. Your caregiver can make recommendations and may suggest nasal or lung inhalers for other symptoms.  HOME CARE INSTRUCTIONS   Only take over-the-counter or prescription medicines for pain, discomfort, or fever as directed by your caregiver.  Use a warm mist humidifier or inhale steam from a shower to increase air moisture. This may keep secretions moist and make it easier to breathe.  Drink enough water and fluids to keep your urine clear or pale yellow.  Rest as needed.  Return to work when your temperature has returned to normal or as your caregiver advises. You may need to stay home longer to avoid infecting others. You can also use a face mask and careful hand washing to prevent spread of the virus. SEEK MEDICAL CARE IF:   After the first few days, you feel you are getting worse rather than better.  You need your caregiver's advice about medicines to control symptoms.  You develop chills, worsening shortness of breath, or brown or red sputum. These may be signs of pneumonia.  You develop yellow or brown nasal discharge or pain in the face, especially when you bend forward. These may be signs of sinusitis.  You develop a fever, swollen neck glands, pain with swallowing, or white areas in the back of your throat. These may be signs of strep throat. SEEK IMMEDIATE MEDICAL CARE IF:   You have a fever.  You develop severe or persistent headache, ear pain, sinus pain, or chest pain.  You develop wheezing, a prolonged cough, cough up blood, or have a change in your usual mucus (if you have chronic lung disease).  You develop sore muscles or a stiff neck. Document Released: 08/15/2000 Document Revised: 05/14/2011 Document Reviewed: 05/27/2013 [facility] Patient Information 2015 [place], [place]. This  information is not intended to replace advice given to you by your health care provider. Make sure you discuss any questions you have with your health care provider.    Viral Infections A viral infection can be caused by different types of viruses.Most viral infections are not serious and resolve on their own. However, some infections may cause severe symptoms and may lead to further complications. SYMPTOMS Viruses can frequently cause:  Minor sore throat.  Aches and pains.  Headaches.  Runny nose.  Different types of rashes.  Watery eyes.  Tiredness.  Cough.  Loss of appetite.  Gastrointestinal infections, resulting in nausea, vomiting, and diarrhea. These symptoms do not respond to antibiotics because the infection is not caused by bacteria. However, you might catch a bacterial infection following the viral infection. This is sometimes called a "superinfection." Symptoms of such a bacterial infection may include:  Worsening sore throat with pus and difficulty swallowing.  Swollen neck glands.  Chills and a high or persistent fever.  Severe headache.  Tenderness over the sinuses.  Persistent overall ill feeling (malaise), muscle aches, and tiredness (fatigue).  Persistent cough.  Yellow, green, or brown mucus production with coughing. HOME CARE INSTRUCTIONS   Only take over-the-counter or prescription medicines for pain, discomfort,  diarrhea, or fever as directed by your caregiver.  Drink enough water and fluids to keep your urine clear or pale yellow. Sports drinks can provide valuable electrolytes, sugars, and hydration.  Get plenty of rest and maintain proper nutrition. Soups and broths with crackers or rice are fine. SEEK IMMEDIATE MEDICAL CARE IF:   You have severe headaches, shortness of breath, chest pain, neck pain, or an unusual rash.  You have uncontrolled vomiting, diarrhea, or you are unable to keep down fluids.  You or your child has an oral  temperature above 102 F (38.9 C), not controlled by medicine.  Your baby is older than 3 months with a rectal temperature of 102 F (38.9 C) or higher.  Your baby is 453 months old or younger with a rectal temperature of 100.4 F (38 C) or higher. MAKE SURE YOU:   Understand these instructions.  Will watch your condition.  Will get help right away if you are not doing well or get worse. Document Released: 11/29/2004 Document Revised: 05/14/2011 Document Reviewed: 06/26/2010 [facility] Patient Information 2015 [place], [place]. This information is not intended to replace advice given to you by your health care provider. Make sure you discuss any questions you have with your health care provider.

## 2014-04-21 NOTE — ED Notes (Signed)
Pt reports chronic headaches. Says Tylenol usually works but hasn't "had a headache like this." Denies blurry vision and denies neck stiffness but has had light sensitivity. No other c/c.

## 2015-06-13 ENCOUNTER — Emergency Department (HOSPITAL_COMMUNITY)
Admission: EM | Admit: 2015-06-13 | Discharge: 2015-06-14 | Disposition: A | Discharge status: Home / Self Care | Payer: No Typology Code available for payment source | Attending: Emergency Medicine | Admitting: Emergency Medicine

## 2015-06-13 ENCOUNTER — Encounter (HOSPITAL_COMMUNITY): Payer: Self-pay | Admitting: *Deleted

## 2015-06-13 DIAGNOSIS — Y9389 Activity, other specified: Secondary | ICD-10-CM | POA: Diagnosis not present

## 2015-06-13 DIAGNOSIS — S3992XA Unspecified injury of lower back, initial encounter: Secondary | ICD-10-CM | POA: Insufficient documentation

## 2015-06-13 DIAGNOSIS — Y998 Other external cause status: Secondary | ICD-10-CM | POA: Insufficient documentation

## 2015-06-13 DIAGNOSIS — Y92410 Unspecified street and highway as the place of occurrence of the external cause: Secondary | ICD-10-CM | POA: Diagnosis not present

## 2015-06-13 DIAGNOSIS — G8929 Other chronic pain: Secondary | ICD-10-CM | POA: Diagnosis not present

## 2015-06-13 DIAGNOSIS — M545 Low back pain: Secondary | ICD-10-CM

## 2015-06-13 DIAGNOSIS — J45909 Unspecified asthma, uncomplicated: Secondary | ICD-10-CM | POA: Diagnosis not present
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
## 2015-06-14 MED ORDER — IBUPROFEN 400 MG PO TABS
800.0000 mg | ORAL_TABLET | Freq: Once | ORAL | Status: AC
  Administered 2015-06-14: 800 mg via ORAL
  Filled 2015-06-14: qty 2

## 2015-06-14 MED ORDER — IBUPROFEN 800 MG PO TABS: 800.0000 mg | ORAL_TABLET | Freq: Three times a day (TID) | ORAL | Status: DC

## 2015-06-14 MED ORDER — METHOCARBAMOL 500 MG PO TABS: 500.0000 mg | ORAL_TABLET | Freq: Four times a day (QID) | ORAL | Status: DC
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
## 2015-06-14 NOTE — ED Notes (Signed)
Patient able to ambulate independently  

## 2016-09-21 ENCOUNTER — Emergency Department (HOSPITAL_COMMUNITY): Payer: Self-pay

## 2016-09-21 ENCOUNTER — Encounter (HOSPITAL_COMMUNITY): Payer: Self-pay | Admitting: Emergency Medicine

## 2016-09-21 ENCOUNTER — Emergency Department (HOSPITAL_COMMUNITY)
Admission: EM | Admit: 2016-09-21 | Discharge: 2016-09-22 | Disposition: A | Discharge status: Home / Self Care | Payer: Self-pay | Attending: Emergency Medicine | Admitting: Emergency Medicine

## 2016-09-21 DIAGNOSIS — J069 Acute upper respiratory infection, unspecified: Secondary | ICD-10-CM | POA: Insufficient documentation

## 2016-09-21 DIAGNOSIS — B9789 Other viral agents as the cause of diseases classified elsewhere: Secondary | ICD-10-CM | POA: Insufficient documentation

## 2016-09-21 DIAGNOSIS — R05 Cough: Secondary | ICD-10-CM | POA: Insufficient documentation

## 2016-09-21 DIAGNOSIS — J45909 Unspecified asthma, uncomplicated: Secondary | ICD-10-CM | POA: Insufficient documentation

## 2016-09-21 MED ORDER — BENZONATATE 100 MG PO CAPS
200.0000 mg | ORAL_CAPSULE | Freq: Once | ORAL | Status: AC
  Administered 2016-09-21: 200 mg via ORAL
  Filled 2016-09-21: qty 2

## 2016-09-21 MED ORDER — BENZONATATE 100 MG PO CAPS: 100.0000 mg | ORAL_CAPSULE | Freq: Three times a day (TID) | ORAL | 0 refills | Status: DC | PRN

## 2016-09-21 MED ORDER — DEXAMETHASONE 4 MG PO TABS
12.0000 mg | ORAL_TABLET | Freq: Once | ORAL | Status: AC
  Administered 2016-09-21: 12 mg via ORAL
  Filled 2016-09-21: qty 3

## 2016-09-21 MED ORDER — ALBUTEROL SULFATE HFA 108 (90 BASE) MCG/ACT IN AERS
2.0000 | INHALATION_SPRAY | Freq: Once | RESPIRATORY_TRACT | Status: AC
  Administered 2016-09-21: 2 via RESPIRATORY_TRACT
  Filled 2016-09-21: qty 6.7

## 2016-09-21 NOTE — ED Notes (Signed)
ED Provider at bedside. 

## 2016-09-21 NOTE — ED Provider Notes (Signed)
[facility]-EMERGENCY DEPT Provider Note   CSN: [id number] Arrival date & time: 09/21/16  2202  By signing my name below, I, Rosario AdieWilliam Andrew Hiatt, attest that this documentation has been prepared under the direction and in the presence of Raeford RazorKohut, Fotios Amos, MD. Electronically Signed: Rosario AdieWilliam Andrew Hiatt, ED Scribe. 09/21/16. 10:49 PM.  History   Chief Complaint Chief Complaint  Patient presents with  . Cough   The history is provided by the patient. No language interpreter was used.    HPI Comments: Willie Chavez is a 31 y.o. male who presents to the Emergency Department complaining of persistent, worsening unproductive cough beginning four days ago. Pt was recently working on some household Therapist, musicconstruction and drywall placement and following this his cough began. He was wearing a mask with this. Pt notes that initially his cough was productive with yellow-green phlegm, however, it has now become unproductive. He reports some associated sharp chest pain and shortness of breath secondary to coughing only. He also reports some mild wheezing. His cough is worse with laying flat and he notes that with deep inspirations he will also experience sharp chest pain. No noted treatments for his cough were tried prior to coming into the ED. No h/o PE/DVT, surgery, fracture, prolonged immobilization. Pt notes that [place] did [place]. He denies fever, leg pain, leg swelling or any other associated symptoms.   Past Medical History:  Diagnosis Date  . Asthma   . Chronic headaches    r/t sinus infections   There are no active problems to display for this patient.  History reviewed. No pertinent surgical history.  Home Medications    Prior to Admission medications   Medication Sig Start Date End Date Taking? Authorizing Provider  acetaminophen (TYLENOL) 500 MG tablet Take 1,000 mg by mouth every 6 (six) hours as needed for moderate pain (pain).   Yes [provider]    omeprazole (PRILOSEC) 20 MG capsule Take 1 capsule (20 mg total) by mouth daily. Patient not taking: Reported on 09/21/2016 06/01/13   Schinlever, Santina Evansatherine, PA-C   Family History History reviewed. No pertinent family history.  Social History Social History  Substance Use Topics  . Smoking status: Never Smoker  . Smokeless tobacco: Never Used  . Alcohol use No   Allergies   Patient has no known allergies.  Review of Systems Review of Systems  Constitutional: Negative for fever.  Respiratory: Positive for cough, shortness of breath and wheezing.   Cardiovascular: Positive for chest pain. Negative for leg swelling.  All other systems reviewed and are negative.  Physical Exam Updated Vital Signs BP 135/84 (BP Location: Left Arm)   Pulse 95   Temp 98.2 F (36.8 C) (Oral)   Resp 20   Ht 6' (1.829 m)   Wt 248 lb (112.5 kg)   SpO2 98%   BMI 33.63 kg/m   Physical Exam  Constitutional: He appears well-developed and well-nourished.  HENT:  Head: Normocephalic.  Right Ear: External ear normal.  Left Ear: External ear normal.  Nose: Nose normal.  Eyes: Conjunctivae are normal. Right eye exhibits no discharge. Left eye exhibits no discharge.  Neck: Normal range of motion.  Cardiovascular: Normal rate, regular rhythm and normal heart sounds.   No murmur heard. Pulmonary/Chest: Effort normal. No respiratory distress. He has wheezes. He has no rales.  Faint expiratory wheezing. Occasional coughing. Speaks in complete sentences.   Abdominal: Soft. There is no tenderness. There is no rebound and no guarding.  Musculoskeletal: Normal range of motion. He exhibits no edema or tenderness.  Neurological: He is alert. No cranial nerve deficit. Coordination normal.  Skin: Skin is warm and dry. No rash noted. No erythema. No pallor.  Psychiatric: He has a normal mood and affect. His behavior is normal.  Nursing note and vitals reviewed.  ED Treatments / Results  DIAGNOSTIC  STUDIES: Oxygen Saturation is 98% on RA, normal by my interpretation.   COORDINATION OF CARE: 10:48 PM-Discussed next steps with pt. Pt verbalized understanding and is agreeable with the plan.   Labs (all labs ordered are listed, but only abnormal results are displayed) Labs Reviewed - No data to display  EKG  EKG Interpretation None      Radiology No results found.   Dg Chest 2 View  Result Date: 09/21/2016 CLINICAL DATA:  Dyspnea with nonproductive cough x4 days. History of asthma. EXAM: CHEST  2 VIEW COMPARISON:  04/21/2014 FINDINGS: The heart size and mediastinal contours are within normal limits. Both lungs are clear. Mild central peribronchial thickening suggestive of mild bronchitic change is again noted. The visualized skeletal structures are unremarkable. IMPRESSION: Stable mild bronchitic change.  No pneumonic consolidation. Electronically Signed   By: Tollie Eth M.D.   On: 09/21/2016 22:52   Procedures Procedures   Medications Ordered in ED Medications - No data to display  Initial Impression / Assessment and Plan / ED Course  I have reviewed the triage vital signs and the nursing notes.  Pertinent labs & imaging results that were available during my care of the patient were reviewed by me and considered in my medical decision making (see chart for details).     31 year old male with cough. He does prescribe pleuritic chest pain. Suspect this is viral bronchitis. There is some peribronchial cuffing on imaging. No focal consolidation. Is afebrile. Oxygen saturations are normal on room air. He has no increased work of breathing. Mild wheezing on exam, but complete speak in complete sentences. No signs or symptoms of DVT. No significant risk factors for DVT/PE.  Final Clinical Impressions(s) / ED Diagnoses   Final diagnoses:  Viral URI with cough   New Prescriptions Discharge Medication List as of 09/21/2016 11:42 PM    START taking these medications   Details   benzonatate (TESSALON PERLES) 100 MG capsule Take 1 capsule (100 mg total) by mouth 3 (three) times daily as needed for cough., Starting Fri 09/21/2016, Print       I personally preformed the services scribed in my presence. The recorded information has been reviewed is accurate. Raeford Razor, MD.     Raeford Razor, MD 09/30/16 [phone]

## 2016-09-21 NOTE — ED Triage Notes (Signed)
Patient complaining of cough that started four days ago. Patient states his chest hurts when he coughs in the middle. Patient is having unproductive cough.

## 2017-06-29 ENCOUNTER — Other Ambulatory Visit: Payer: Self-pay

## 2017-06-29 ENCOUNTER — Encounter (HOSPITAL_COMMUNITY): Payer: Self-pay

## 2017-06-29 ENCOUNTER — Emergency Department (HOSPITAL_COMMUNITY)
Admission: EM | Admit: 2017-06-29 | Discharge: 2017-06-29 | Disposition: A | Discharge status: Home / Self Care | Payer: Self-pay | Attending: Emergency Medicine | Admitting: Emergency Medicine

## 2017-06-29 DIAGNOSIS — Y999 Unspecified external cause status: Secondary | ICD-10-CM | POA: Insufficient documentation

## 2017-06-29 DIAGNOSIS — J45909 Unspecified asthma, uncomplicated: Secondary | ICD-10-CM | POA: Insufficient documentation

## 2017-06-29 DIAGNOSIS — W540XXA Bitten by dog, initial encounter: Secondary | ICD-10-CM | POA: Insufficient documentation

## 2017-06-29 DIAGNOSIS — Y9389 Activity, other specified: Secondary | ICD-10-CM | POA: Insufficient documentation

## 2017-06-29 DIAGNOSIS — Z23 Encounter for immunization: Secondary | ICD-10-CM | POA: Insufficient documentation

## 2017-06-29 DIAGNOSIS — S01412A Laceration without foreign body of left cheek and temporomandibular area, initial encounter: Secondary | ICD-10-CM | POA: Insufficient documentation

## 2017-06-29 DIAGNOSIS — Y92099 Unspecified place in other non-institutional residence as the place of occurrence of the external cause: Secondary | ICD-10-CM | POA: Insufficient documentation

## 2017-06-29 MED ORDER — LIDOCAINE-EPINEPHRINE (PF) 2 %-1:200000 IJ SOLN: 5.0000 mL | Freq: Once | INTRAMUSCULAR | Status: DC

## 2017-06-29 MED ORDER — AMOXICILLIN-POT CLAVULANATE 875-125 MG PO TABS
1.0000 | ORAL_TABLET | Freq: Once | ORAL | Status: AC
Start: 2017-06-29 — End: 2017-06-29
  Administered 2017-06-29: 1 via ORAL
  Filled 2017-06-29: qty 1

## 2017-06-29 MED ORDER — AMOXICILLIN-POT CLAVULANATE 875-125 MG PO TABS: 1.0000 | ORAL_TABLET | Freq: Two times a day (BID) | ORAL | 0 refills | Status: DC

## 2017-06-29 MED ORDER — TETANUS-DIPHTH-ACELL PERTUSSIS 5-2.5-18.5 LF-MCG/0.5 IM SUSP
0.5000 mL | Freq: Once | INTRAMUSCULAR | Status: AC
  Administered 2017-06-29: 0.5 mL via INTRAMUSCULAR
  Filled 2017-06-29: qty 0.5

## 2017-06-29 NOTE — ED Provider Notes (Signed)
[facility] EMERGENCY DEPARTMENT Provider Note   CSN: [id number] Arrival date & time: 06/29/17  1358     History   Chief Complaint No chief complaint on file.   HPI Willie Chavez is a 32 y.o. male with no significant past medical history presents emergency department today for dog bite.  She states that approximately 2 hours ago he was taking his son to his grandma's house when the neighbor's dog lunged and bit him in the face.  He notes that he has a small laceration across his left cheek.  He also notes that he has a small bruise on the inside of his right lower lip.  Bleeding is currently controlled.  He notes that the animal that attacked him is up-to-date on his rabies vaccine.  Patient is unsure of his tetanus status.  HPI  Past Medical History:  Diagnosis Date  . Asthma   . Chronic headaches    r/t sinus infections    There are no active problems to display for this patient.   History reviewed. No pertinent surgical history.      Home Medications    Prior to Admission medications   Medication Sig Start Date End Date Taking? Authorizing Provider  acetaminophen (TYLENOL) 500 MG tablet Take 1,000 mg by mouth every 6 (six) hours as needed for moderate pain (pain).    [provider]  benzonatate (TESSALON PERLES) 100 MG capsule Take 1 capsule (100 mg total) by mouth 3 (three) times daily as needed for cough. 09/21/16   Raeford Razor, MD  omeprazole (PRILOSEC) 20 MG capsule Take 1 capsule (20 mg total) by mouth daily. Patient not taking: Reported on 09/21/2016 06/01/13   Schinlever, Santina Evans, PA-C    Family History No family history on file.  Social History Social History   Tobacco Use  . Smoking status: Never Smoker  . Smokeless tobacco: Never Used  Substance Use Topics  . Alcohol use: No  . Drug use: No     Allergies   Patient has no known allergies.   Review of Systems Review of Systems  All other systems reviewed and  are negative.    Physical Exam Updated Vital Signs BP (!) 146/72 (BP Location: Right Arm)   Pulse 91   Temp 98.6 F (37 C) (Oral)   Resp 16   SpO2 98%   Physical Exam  Constitutional: He appears well-developed and well-nourished.  HENT:  Head: Normocephalic. Head is without raccoon's eyes and without Battle's sign.    Right Ear: External ear normal.  Left Ear: External ear normal.  Patient with small contusion to right lower inner lip. No laceration noted.  Dentition intact.  Eyes: Conjunctivae are normal. Right eye exhibits no discharge. Left eye exhibits no discharge. No scleral icterus.  Pulmonary/Chest: Effort normal. No respiratory distress.  Neurological: He is alert.  Skin: No pallor.  Psychiatric: He has a normal mood and affect.  Nursing note and vitals reviewed.    ED Treatments / Results  Labs (all labs ordered are listed, but only abnormal results are displayed) Labs Reviewed - No data to display  EKG None  Radiology No results found.  Procedures .Marland KitchenLaceration Repair Date/Time: 06/29/2017 4:53 PM Performed by: Jacinto Halim, PA-C Authorized by: Jacinto Halim, PA-C   Consent:    Consent obtained:  Verbal   Consent given by:  Patient   Risks discussed:  Infection, need for additional repair, poor wound healing, nerve damage, poor cosmetic result, pain,  retained foreign body, tendon damage and vascular damage   Alternatives discussed:  No treatment Anesthesia (see MAR for exact dosages):    Anesthesia method:  None Laceration details:    Location:  Face   Face location:  L cheek   Length (cm):  1 Repair type:    Repair type:  Simple Pre-procedure details:    Preparation:  Patient was prepped and draped in usual sterile fashion Exploration:    Wound exploration: wound explored through full range of motion and entire depth of wound probed and visualized   Treatment:    Area cleansed with:  Shur-Clens   Amount of cleaning:  Standard    Irrigation solution:  Sterile saline   Irrigation volume:  100   Irrigation method:  Syringe   Visualized foreign bodies/material removed: no   Skin repair:    Repair method:  Sutures   Suture size:  6-0   Suture material:  Chromic gut   Suture technique:  Simple interrupted   Number of sutures:  1 Approximation:    Approximation:  Close Post-procedure details:    Dressing:  Non-adherent dressing and antibiotic ointment   Patient tolerance of procedure:  Tolerated well, no immediate complications   (including critical care time)  Medications Ordered in ED Medications  Tdap (BOOSTRIX) injection 0.5 mL (has no administration in time range)  lidocaine-EPINEPHrine (XYLOCAINE W/EPI) 2 %-1:200000 (PF) injection 5 mL (has no administration in time range)  amoxicillin-clavulanate (AUGMENTIN) 875-125 MG per tablet 1 tablet (has no administration in time range)     Initial Impression / Assessment and Plan / ED Course  I have reviewed the triage vital signs and the nursing notes.  Pertinent labs & imaging results that were available during my care of the patient were reviewed by me and considered in my medical decision making (see chart for details).     32 year old male that presents after dog bites.  Patient did have a 1 cm laceration to the left upper cheek.  This was on the patient's face so I did repair this area.  The wound was explored through a full range of motion without evidence of foreign body.  Area was cleansed with Shur-Clens cleaner as well as pressure irrigation.  The laceration occurred less than 8 hours ago.  Patient tolerated the procedure well.  Patient also noted a small contusion to the right inner lip.  There is no evidence of lip laceration that would require repair.  Patient's tetanus status was not up-to-date.  His Tdap was updated.  Animal that attacked the patient is up-to-date on immunizations.  No need for rabies prophylaxis.  Start the patient on Augmentin.   First dose given in the emergency department. I advised the patient to follow-up with PCP this week. He is to return in 2 days for wound re-check. Specific return precautions discussed. Time was given for all questions to be answered. The patient verbalized understanding and agreement with plan. The patient appears safe for discharge home.  Final Clinical Impressions(s) / ED Diagnoses   Final diagnoses:  Dog bite, initial encounter  Laceration of left cheek, initial encounter    ED Discharge Orders        Ordered    amoxicillin-clavulanate (AUGMENTIN) 875-125 MG tablet  Every 12 hours     06/29/17 1654       Princella Pellegrini 06/29/17 1656    Doug Sou, MD 06/30/17 [phone]

## 2017-06-29 NOTE — Discharge Instructions (Signed)
Your facial laceration was repaired using absorbable sutures.  Out on suture wound care.  Please take all of your antibiotics until finished!   You may develop abdominal discomfort or diarrhea from the antibiotic.  You may help offset this with probiotics which you can buy or get in yogurt. Do not eat or take the probiotics until 2 hours after your antibiotic. Do not take your medicine if develop an itchy rash, swelling in your mouth or lips, or difficulty breathing.  If you find for whatever reason the animal that attacked you is not up to date on their rabies vaccine, please return to the department.  You tetanus vaccine was updated today.  Return in 2 days for wound recheck to make sure the area is healing well. Please return if you have any fever, redness surrounding the laceration, white drainage coming from the area or any other concerning symptoms.

## 2017-06-29 NOTE — ED Triage Notes (Signed)
PT reports being attacked by fathers pit bull pta. Pt has laceration to left cheek and lip. Unsure if dog was UTD on shots. Last tetanus unknown

## 2017-07-14 ENCOUNTER — Encounter (HOSPITAL_COMMUNITY): Payer: Self-pay | Admitting: Emergency Medicine

## 2017-07-14 ENCOUNTER — Emergency Department (HOSPITAL_COMMUNITY): Payer: Self-pay

## 2017-07-14 ENCOUNTER — Other Ambulatory Visit: Payer: Self-pay

## 2017-07-14 ENCOUNTER — Emergency Department (HOSPITAL_COMMUNITY)
Admission: EM | Admit: 2017-07-14 | Discharge: 2017-07-14 | Disposition: A | Discharge status: Home / Self Care | Payer: Self-pay | Attending: Emergency Medicine | Admitting: Emergency Medicine

## 2017-07-14 DIAGNOSIS — B9789 Other viral agents as the cause of diseases classified elsewhere: Secondary | ICD-10-CM | POA: Insufficient documentation

## 2017-07-14 DIAGNOSIS — J028 Acute pharyngitis due to other specified organisms: Secondary | ICD-10-CM | POA: Insufficient documentation

## 2017-07-14 DIAGNOSIS — J45909 Unspecified asthma, uncomplicated: Secondary | ICD-10-CM | POA: Insufficient documentation

## 2017-07-14 DIAGNOSIS — R0602 Shortness of breath: Secondary | ICD-10-CM | POA: Insufficient documentation

## 2017-07-14 LAB — BASIC METABOLIC PANEL
Anion gap: 9 (ref 5–15)
BUN: 14 mg/dL (ref 6–20)
CO2: 25 mmol/L (ref 22–32)
Calcium: 9 mg/dL (ref 8.9–10.3)
Chloride: 108 mmol/L (ref 101–111)
Creatinine, Ser: 1.01 mg/dL (ref 0.61–1.24)
GFR calc Af Amer: >60 mL/min (ref >60)
GFR calc non Af Amer: >60 mL/min (ref >60)
Glucose, Bld: 102 mg/dL — ABNORMAL HIGH (ref 65–99)
Potassium: 3.8 mmol/L (ref 3.5–5.1)
Sodium: 142 mmol/L (ref 135–145)

## 2017-07-14 LAB — CBC WITH DIFFERENTIAL/PLATELET
Basophils Absolute: 0.1 10*3/uL (ref 0.0–0.1)
Basophils Relative: 1 %
Eosinophils Absolute: 0.2 10*3/uL (ref 0.0–0.7)
Eosinophils Relative: 2 %
HCT: 41.9 % (ref 39.0–52.0)
Hemoglobin: 14.2 g/dL (ref 13.0–17.0)
Lymphocytes Relative: 16 %
Lymphs Abs: 1.6 10*3/uL (ref 0.7–4.0)
MCH: 27.8 pg (ref 26.0–34.0)
MCHC: 33.9 g/dL (ref 30.0–36.0)
MCV: 82 fL (ref 78.0–100.0)
Monocytes Absolute: 0.6 10*3/uL (ref 0.1–1.0)
Monocytes Relative: 6 %
Neutro Abs: 7.5 10*3/uL (ref 1.7–7.7)
Neutrophils Relative %: 75 %
Platelets: 222 10*3/uL (ref 150–400)
RBC: 5.11 MIL/uL (ref 4.22–5.81)
RDW: 13.2 % (ref 11.5–15.5)
WBC: 10 10*3/uL (ref 4.0–10.5)

## 2017-07-14 LAB — GROUP A STREP BY PCR: Group A Strep by PCR: NOT DETECTED

## 2017-07-14 MED ORDER — GUAIFENESIN ER 1200 MG PO TB12: 1.0000 | ORAL_TABLET | Freq: Two times a day (BID) | ORAL | 0 refills | Status: DC

## 2017-07-14 MED ORDER — PREDNISONE 50 MG PO TABS: 50.0000 mg | ORAL_TABLET | Freq: Every day | ORAL | 0 refills | Status: DC

## 2017-07-14 MED ORDER — ACETAMINOPHEN-CODEINE 120-12 MG/5ML PO SOLN: 10.0000 mL | ORAL | 0 refills | Status: DC | PRN

## 2017-07-14 MED ORDER — IOHEXOL 300 MG/ML  SOLN
100.0000 mL | Freq: Once | INTRAMUSCULAR | Status: AC | PRN
  Administered 2017-07-14: 75 mL via INTRAVENOUS

## 2017-07-14 NOTE — ED Triage Notes (Signed)
Pt comes in with complaints of a swollen throat and accompanied difficulty breathing. Patient states that his throat began to hurt 3 days ago and has gotten progressively worse. Patient does have his tonsils and swelling is noted. Speak is garbled. Patient also states he has a productive cough with white sputum.

## 2017-07-14 NOTE — ED Notes (Signed)
No respiratory or acute distress noted alert and oriented x 3 clear speech noted no drooling noted.

## 2017-07-14 NOTE — ED Provider Notes (Signed)
[facility]EMER[facility] DEPT Provider Note   CSN: [id number] Arrival date & time: 07/14/17  0504     History   Chief Complaint Chief Complaint  Patient presents with  . Sore Throat  . Shortness of Breath    HPI Willie Chavez is a 32 y.o. male.  HPI Patient presents to the emergency department with sore throat and complaints in his throat feels like it swelling.  The patient does not have shortness of breath but does state that he feels like he has trouble breathing because of the swelling in his throat.  Patient states that nothing seems to make the condition better or worse.  Patient states that he took over-the-counter medication not significant relief of his symptoms. Past Medical History:  Diagnosis Date  . Asthma   . Chronic headaches    r/t sinus infections    There are no active problems to display for this patient.   History reviewed. No pertinent surgical history.      Home Medications    Prior to Admission medications   Medication Sig Start Date End Date Taking? Authorizing Provider  acetaminophen (TYLENOL) 500 MG tablet Take 1,000 mg by mouth every 6 (six) hours as needed for moderate pain (pain).   Yes [provider]  omeprazole (PRILOSEC) 20 MG capsule Take 1 capsule (20 mg total) by mouth daily. Patient not taking: Reported on 09/21/2016 06/01/13   Schinlever, Santina Evans, PA-C    Family History No family history on file.  Social History Social History   Tobacco Use  . Smoking status: Never Smoker  . Smokeless tobacco: Never Used  Substance Use Topics  . Alcohol use: No  . Drug use: No     Allergies   Patient has no known allergies.   Review of Systems Review of Systems All other systems negative except as documented in the HPI. All pertinent positives and negatives as reviewed in the HPI.  Physical Exam Updated Vital Signs BP 139/80   Pulse (!) 59   Temp 98 F (36.7 C) (Oral)   Resp 18   Ht 6'  (1.829 m)   Wt 117.9 kg (260 lb)   SpO2 96%   BMI 35.26 kg/m   Physical Exam  Constitutional: He is oriented to person, place, and time. He appears well-developed and well-nourished. No distress.  HENT:  Head: Normocephalic and atraumatic.  Mouth/Throat: No uvula swelling. Posterior oropharyngeal edema and posterior oropharyngeal erythema present. No tonsillar abscesses.  Eyes: Pupils are equal, round, and reactive to light.  Neck: Normal range of motion. Neck supple.  Cardiovascular: Normal rate, regular rhythm and normal heart sounds. Exam reveals no gallop and no friction rub.  No murmur heard. Pulmonary/Chest: Effort normal and breath sounds normal. No respiratory distress. He has no wheezes.  Abdominal: Soft. Bowel sounds are normal. He exhibits no distension. There is no tenderness.  Neurological: He is alert and oriented to person, place, and time. He exhibits normal muscle tone. Coordination normal.  Skin: Skin is warm and dry. Capillary refill takes less than 2 seconds. No rash noted. No erythema.  Psychiatric: He has a normal mood and affect. His behavior is normal.  Nursing note and vitals reviewed.    ED Treatments / Results  Labs (all labs ordered are listed, but only abnormal results are displayed) Labs Reviewed  BASIC METABOLIC PANEL - Abnormal; Notable for the following components:      Result Value   Glucose, Bld 102 (*)  All other components within normal limits  GROUP A STREP BY PCR  CBC WITH DIFFERENTIAL/PLATELET    EKG None  Radiology Ct Soft Tissue Neck W Contrast  Result Date: 07/14/2017 CLINICAL DATA:  Sore throat over the last 3 days. EXAM: CT NECK WITH CONTRAST TECHNIQUE: Multidetector CT imaging of the neck was performed using the standard protocol following the bolus administration of intravenous contrast. CONTRAST:  75mL OMNIPAQUE IOHEXOL 300 MG/ML  SOLN COMPARISON:  Cervical spine CT 01/24/2005 FINDINGS: Pharynx and larynx: Pharyngeal mucosal  tissue in general does appear somewhat inflamed, but there is no asymmetry. No evidence of tonsillar abscess. Salivary glands: Parotid and submandibular glands are normal. Thyroid: Normal Lymph nodes: No enlarged or low-density nodes on either side of the neck. Vascular: No abnormal vascular finding. Limited intracranial: Negative Visualized orbits: Normal Mastoids and visualized paranasal sinuses: Mastoids are clear. Paranasal sinuses show areas of mucosal thickening, scattered ethmoid opacified air cells, scattered retention cysts, and complete opacification of the right maxillary sinus. Skeleton: Negative Upper chest: Negative Other: None IMPRESSION: Question pharyngitis, but without evidence of abscess or asymmetric process. Electronically Signed   By: Paulina Fusi M.D.   On: 07/14/2017 08:03    Procedures Procedures (including critical care time)  Medications Ordered in ED Medications  iohexol (OMNIPAQUE) 300 MG/ML solution 100 mL (75 mLs Intravenous Contrast Given 07/14/17 0728)     Initial Impression / Assessment and Plan / ED Course  I have reviewed the triage vital signs and the nursing notes.  Pertinent labs & imaging results that were available during my care of the patient were reviewed by me and considered in my medical decision making (see chart for details).     She will be treated for pharyngitis told to return here as needed.  Patient had a CT scan done of his neck and throat due to the fact that he felt like there was swelling causing him to feel like he was having trouble breathing and swallowing.  There is no signs of abscess or deep space infection in the neck.  We will treat the patient for the pharyngitis told to increase his fluid intake and rest as much as possible.   Final Clinical Impressions(s) / ED Diagnoses   Final diagnoses:  None    ED Discharge Orders    None       Kyra Manges 07/14/17 1610    Dione Booze, MD 07/14/17 2239

## 2017-07-14 NOTE — Discharge Instructions (Addendum)
Return here as needed.  Increase your fluid intake and rest as much as possible.  Your CT scan did not show any significant abnormality at this time and your strep test was negative.  You do have pharyngitis which is the cause of the swelling and pain.

## 2017-11-18 ENCOUNTER — Encounter (HOSPITAL_COMMUNITY): Payer: Self-pay | Admitting: Emergency Medicine

## 2017-11-18 ENCOUNTER — Ambulatory Visit (HOSPITAL_COMMUNITY)
Admission: EM | Admit: 2017-11-18 | Discharge: 2017-11-18 | Disposition: A | Discharge status: Home / Self Care | Payer: Self-pay | Attending: Family Medicine | Admitting: Family Medicine

## 2017-11-18 ENCOUNTER — Other Ambulatory Visit: Payer: Self-pay

## 2017-11-18 DIAGNOSIS — K0889 Other specified disorders of teeth and supporting structures: Secondary | ICD-10-CM

## 2017-11-18 MED ORDER — TRAMADOL HCL 50 MG PO TABS: 50.0000 mg | ORAL_TABLET | Freq: Four times a day (QID) | ORAL | 0 refills | Status: DC | PRN

## 2017-11-18 MED ORDER — AMOXICILLIN-POT CLAVULANATE 875-125 MG PO TABS: 1.0000 | ORAL_TABLET | Freq: Two times a day (BID) | ORAL | 0 refills | Status: DC

## 2017-11-18 NOTE — ED Provider Notes (Signed)
[facility]    CSN: [id number] Arrival date & time: 11/18/17  7829     History   Chief Complaint Chief Complaint  Patient presents with  . Dental Pain    HPI Willie Chavez is a 32 y.o. male history of asthma presenting today for evaluation of dental pain.  Patient states that 4 days ago he began to have swelling around his left upper posterior molar.  Since he has developed pain as well.  He has been using Tylenol without relief.  States that he has had all but 1 of his wisdom teeth taken out, but that tooth is located on the right upper side of his jaw.  He does not have dental insurance at this time, showed have insurance in approximately 1 month.  Denies fevers, difficulty moving neck.  Denies difficulty swallowing.  Denies previous issues with this tooth. HPI  Past Medical History:  Diagnosis Date  . Asthma   . Chronic headaches    r/t sinus infections    There are no active problems to display for this patient.   History reviewed. No pertinent surgical history.     Home Medications    Prior to Admission medications   Medication Sig Start Date End Date Taking? Authorizing Provider  amoxicillin-clavulanate (AUGMENTIN) 875-125 MG tablet Take 1 tablet by mouth every 12 (twelve) hours. 11/18/17   Ladonne Sharples C, PA-C  traMADol (ULTRAM) 50 MG tablet Take 1 tablet (50 mg total) by mouth every 6 (six) hours as needed. 11/18/17   Adlee Paar, Junius Creamer, PA-C    Family History Family History  Problem Relation Age of Onset  . Hypertension Father     Social History Social History   Tobacco Use  . Smoking status: Never Smoker  . Smokeless tobacco: Never Used  Substance Use Topics  . Alcohol use: No  . Drug use: No     Allergies   Patient has no known allergies.   Review of Systems Review of Systems  Constitutional: Negative for activity change, appetite change, chills, fatigue and fever.  HENT: Positive for dental problem. Negative for  congestion, ear pain, rhinorrhea, sinus pressure, sore throat and trouble swallowing.   Eyes: Negative for discharge and redness.  Respiratory: Negative for cough, chest tightness and shortness of breath.   Cardiovascular: Negative for chest pain.  Gastrointestinal: Negative for abdominal pain, diarrhea, nausea and vomiting.  Musculoskeletal: Negative for myalgias, neck pain and neck stiffness.  Skin: Negative for rash.  Neurological: Negative for dizziness, light-headedness and headaches.     Physical Exam Triage Vital Signs ED Triage Vitals  Enc Vitals Group     BP 11/18/17 0828 126/76     Pulse Rate 11/18/17 0828 67     Resp 11/18/17 0828 18     Temp 11/18/17 0828 98.4 F (36.9 C)     Temp Source 11/18/17 0828 Oral     SpO2 11/18/17 0828 99 %     Weight --      Height --      Head Circumference --      Peak Flow --      Pain Score 11/18/17 0826 6     Pain Loc --      Pain Edu? --      Excl. in GC? --    No data found.  Updated Vital Signs BP 126/76 (BP Location: Left Arm) Comment: large cuff  Pulse 67   Temp 98.4 F (36.9 C) (Oral)   Resp  18   SpO2 99%   Visual Acuity Right Eye Distance:   Left Eye Distance:   Bilateral Distance:    Right Eye Near:   Left Eye Near:    Bilateral Near:     Physical Exam  Constitutional: He is oriented to person, place, and time. He appears well-developed and well-nourished.  No acute distress  HENT:  Head: Normocephalic and atraumatic.  Nose: Nose normal.  Swollen gingiva surrounding left upper posterior molar, tender to palpation, no obvious fracture or broken tooth  Posterior pharynx patent, no soft palate swelling  Nontender to palpation to soft palate below the tongue  Eyes: Conjunctivae are normal.  Neck: Neck supple.  Full active range of motion of neck, no overlying swelling or erythema  Cardiovascular: Normal rate.  Pulmonary/Chest: Effort normal. No respiratory distress.  Abdominal: He exhibits no  distension.  Musculoskeletal: Normal range of motion.  Neurological: He is alert and oriented to person, place, and time.  Skin: Skin is warm and dry.  Psychiatric: He has a normal mood and affect.  Nursing note and vitals reviewed.    UC Treatments / Results  Labs (all labs ordered are listed, but only abnormal results are displayed) Labs Reviewed - No data to display  EKG None  Radiology No results found.  Procedures Procedures (including critical care time)  Medications Ordered in UC Medications - No data to display  Initial Impression / Assessment and Plan / UC Course  I have reviewed the triage vital signs and the nursing notes.  Pertinent labs & imaging results that were available during my care of the patient were reviewed by me and considered in my medical decision making (see chart for details).     Patient with abdominal pain, possible dental infection, will treat with Augmentin to cover any underlying infection, provided 2 days worth of tramadol to use only for severe pain as antibiotic begins to work or at nighttime.  Advised not to use at work or when needing to drive.  Tylenol and ibuprofen during the day and for mild to moderate pain.  Follow-up with dentistry.Discussed strict return precautions. Patient verbalized understanding and is agreeable with plan.  Final Clinical Impressions(s) / UC Diagnoses   Final diagnoses:  Pain, dental     Discharge Instructions     Please use dental resource to contact offices to seek permenant treatment/relief.   Today we have given you an antibiotic. This should help with pain as any infection is cleared.   For pain please take 600mg -800mg  of Ibuprofen every 8 hours, take with 1000 mg of Tylenol Extra strength every 8 hours. These are safe to take together. Please take with food.   I have also provided 2 days worth of stronger pain medication. This should only be used for severe pain. Do not drive or operate machinery  while taking this medication.   Please return if you start to experience significant swelling of your face, experiencing fever.   ED Prescriptions    Medication Sig Dispense Auth. Provider   amoxicillin-clavulanate (AUGMENTIN) 875-125 MG tablet Take 1 tablet by mouth every 12 (twelve) hours. 14 tablet Ilyana Manuele C, PA-C   traMADol (ULTRAM) 50 MG tablet Take 1 tablet (50 mg total) by mouth every 6 (six) hours as needed. 10 tablet Jahni Paul, [place] C, PA-C     Controlled Substance Prescriptions [place] Controlled Substance Registry consulted? Yes, I have consulted the [place] Controlled Substances Registry for this patient, and feel the risk/benefit ratio today is  favorable for proceeding with this prescription for a controlled substance.   Lew Dawes, [place] 11/18/17 [phone]

## 2017-11-18 NOTE — Discharge Instructions (Signed)

## 2017-11-18 NOTE — ED Triage Notes (Signed)
Dental pain started 4 days ago.  Tooth hurting is top, left.  Patient does not have a dentist

## 2019-04-09 ENCOUNTER — Emergency Department (HOSPITAL_COMMUNITY)
Admission: EM | Admit: 2019-04-09 | Discharge: 2019-04-09 | Discharge status: Against Medical Advice | Payer: Self-pay | Attending: Emergency Medicine | Admitting: Emergency Medicine

## 2019-04-09 DIAGNOSIS — R42 Dizziness and giddiness: Secondary | ICD-10-CM | POA: Insufficient documentation

## 2019-04-09 DIAGNOSIS — Z5320 Procedure and treatment not carried out because of patient's decision for unspecified reasons: Secondary | ICD-10-CM | POA: Insufficient documentation

## 2019-04-09 LAB — BASIC METABOLIC PANEL
Anion gap: 9 (ref 5–15)
BUN: 9 mg/dL (ref 6–20)
CO2: 25 mmol/L (ref 22–32)
Calcium: 9.1 mg/dL (ref 8.9–10.3)
Chloride: 106 mmol/L (ref 98–111)
Creatinine, Ser: 1.04 mg/dL (ref 0.61–1.24)
GFR calc Af Amer: >60 mL/min (ref >60)
GFR calc non Af Amer: >60 mL/min (ref >60)
Glucose, Bld: 144 mg/dL — ABNORMAL HIGH (ref 70–99)
Potassium: 3.8 mmol/L (ref 3.5–5.1)
Sodium: 140 mmol/L (ref 135–145)

## 2019-04-09 LAB — CBC
HCT: 42.9 % (ref 39.0–52.0)
Hemoglobin: 14.2 g/dL (ref 13.0–17.0)
MCH: 27.6 pg (ref 26.0–34.0)
MCHC: 33.1 g/dL (ref 30.0–36.0)
MCV: 83.5 fL (ref 80.0–100.0)
Platelets: 221 10*3/uL (ref 150–400)
RBC: 5.14 MIL/uL (ref 4.22–5.81)
RDW: 13 % (ref 11.5–15.5)
WBC: 11.7 10*3/uL — ABNORMAL HIGH (ref 4.0–10.5)
nRBC: 0 % (ref 0.0–0.2)

## 2019-04-09 LAB — HEPATIC FUNCTION PANEL
ALT: 19 U/L (ref 0–44)
AST: 21 U/L (ref 15–41)
Albumin: 3.7 g/dL (ref 3.5–5.0)
Alkaline Phosphatase: 77 U/L (ref 38–126)
Bilirubin, Direct: 0.2 mg/dL (ref 0.0–0.2)
Indirect Bilirubin: 0.5 mg/dL (ref 0.3–0.9)
Total Bilirubin: 0.7 mg/dL (ref 0.3–1.2)
Total Protein: 6.8 g/dL (ref 6.5–8.1)

## 2019-04-09 LAB — ETHANOL: Alcohol, Ethyl (B): <10 mg/dL (ref <10)

## 2019-04-09 MED ORDER — SODIUM CHLORIDE 0.9% FLUSH: 3.0000 mL | Freq: Once | INTRAVENOUS | Status: DC

## 2019-04-09 NOTE — ED Notes (Signed)
Called lab to added hepatic function test to current sample.

## 2019-04-09 NOTE — ED Notes (Signed)
Into introduce self to patient, pt standing up in room stating he "has to go to work" and this is "taking a little too long". Reporting he wants to leave. Notified [place], [place] of the same. Discussed with pt that he would have to sign out AMA, AMA form reviewed. IV removed. Pt ambulatory leaving at this time.

## 2019-04-09 NOTE — ED Triage Notes (Signed)
Arrived via ems; from home. Wife reported pt stood up upon waking and passed out and been altered since. EMS reported patient is unresponsive to painful stimuli and uncooperative with neuro exam. EMS reported patient opens eyes on arrival to hospital. Wife endorsed patient complaint of headache earlier today. EMS reported patient has been on "phentermine" x 3 months for weight loss.

## 2019-04-09 NOTE — ED Provider Notes (Signed)
[facility] EMERGENCY DEPARTMENT Provider Note   CSN: [id number] Arrival date & time: 04/09/19  2119     History Chief Complaint  Patient presents with  . Loss of Consciousness    Willie Chavez is a 34 y.o. male.  34 year old male with a history of asthma and chronic headaches presents to the emergency department following a syncopal episode and episode of altered mental status.  Patient states that he woke this morning feeling very fatigued and generally weak.  He recalls sitting on the side of his bed when he had onset of a global headache with dizziness characterized as a room spinning sensation.  He next recalls being on the floor.  Wife reported to EMS that patient had prolonged episode of altered mental status.  He was supposedly unresponsive to painful stimuli and uncooperative when EMS arrived on scene.  Patient currently states that he feels back at baseline.  He denies any headache currently. Further denies any extremity numbness, paresthesias, weakness, chest pain or shortness of breath prior to his syncopal event.  No nausea, vomiting, bowel/bladder incontinence, tongue biting, recent fevers, sick contacts.   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 Patient sleeping poorly as a result. States appetite has been "okay". He has been using phentermine and hCG shots for weight loss, but denies use of this in the past 2 weeks.   Loss of Consciousness      Past Medical History:  Diagnosis Date  . Asthma   . Chronic headaches    r/t sinus infections    There are no problems to display for this patient.   No past surgical history on file.     Family History  Problem Relation Age of Onset  . Hypertension Father     Social History   Tobacco Use  . Smoking  status: Never Smoker  . Smokeless tobacco: Never Used  Substance Use Topics  . Alcohol use: No  . Drug use: No    Home Medications Prior to Admission medications   Medication Sig Start Date End Date Taking? Authorizing Provider  amoxicillin-clavulanate (AUGMENTIN) 875-125 MG tablet Take 1 tablet by mouth every 12 (twelve) hours. 11/18/17   Wieters, Hallie C, PA-C  traMADol (ULTRAM) 50 MG tablet Take 1 tablet (50 mg total) by mouth every 6 (six) hours as needed. 11/18/17   Wieters, Hallie C, PA-C    Allergies    Patient has no known allergies.  Review of Systems   Review of Systems  Cardiovascular: Positive for syncope.  Ten systems reviewed and are negative for acute change, except as noted in the HPI.    Physical Exam Updated Vital Signs BP 125/75   Pulse 87   Temp 99.9 F (37.7 C) (Oral)   Resp 19   SpO2 99%   Physical Exam Vitals and nursing note reviewed.  Constitutional:      General: He is not in acute distress.    Appearance: He is well-developed. He is not diaphoretic.     Comments: Nontoxic appearing and in NAD  HENT:     Head: Normocephalic and atraumatic.  Eyes:     General: No scleral icterus.    Conjunctiva/sclera: Conjunctivae normal.  Cardiovascular:     Rate and Rhythm: Normal rate and regular rhythm.  Pulses: Normal pulses.  Pulmonary:     Effort: Pulmonary effort is normal. No respiratory distress.     Breath sounds: No stridor. No wheezing, rhonchi or rales.     Comments: Respirations even and unlabored. Lungs CTAB. Abdominal:     General: There is no distension.     Palpations: Abdomen is soft.  Musculoskeletal:        General: Normal range of motion.     Cervical back: Normal range of motion.  Skin:    General: Skin is warm and dry.     Coloration: Skin is not pale.     Findings: No erythema or rash.  Neurological:     Mental Status: He is alert and oriented to person, place, and time.     Comments: GCS 15. Speech is goal oriented.  Answers questions appropriately and follows commands. No gross focal deficits noted. Sensation to light touch intact. Patient moves extremities without ataxia.  Psychiatric:        Behavior: Behavior normal.     ED Results / Procedures / Treatments   Labs (all labs ordered are listed, but only abnormal results are displayed) Labs Reviewed  BASIC METABOLIC PANEL - Abnormal; Notable for the following components:      Result Value   Glucose, Bld 144 (*)    All other components within normal limits  CBC - Abnormal; Notable for the following components:   WBC 11.7 (*)    All other components within normal limits  ETHANOL  HEPATIC FUNCTION PANEL  URINALYSIS, ROUTINE W REFLEX MICROSCOPIC  RAPID URINE DRUG SCREEN, HOSP PERFORMED  CBG MONITORING, ED    EKG EKG Interpretation  Date/Time:  Thursday April 09 2019 21:32:44 EST Ventricular Rate:  92 PR Interval:    QRS Duration: 93 QT Interval:  329 QTC Calculation: 407 R Axis:   78 Text Interpretation: Sinus rhythm No acute changes Confirmed by Addison Lank [phone]) on 04/09/2019 11:08:25 PM   Radiology No results found.  Procedures Procedures (including critical care time)  Medications Ordered in ED Medications  sodium chloride flush (NS) 0.9 % injection 3 mL (has no administration in time range)    ED Course  I have reviewed the triage vital signs and the nursing notes.  Pertinent labs & imaging results that were available during my care of the patient were reviewed by me and considered in my medical decision making (see chart for details).  Clinical Course as of Apr 10 319  Thu Apr 09, 2019  2308 Notified by Claiborne Billings, RN that patient is wanting to leave. His work up is pending and do not feel he is appropriate for discharge at this time. RN notified that patient is able to leave AMA if he no longer wishes to remain in the department.   [KD]  9833 Patient seen ambulating out of the department in stable condition; left  AMA.   [KH]    Clinical Course User Index [KH] Beverely Pace   MDM Rules/Calculators/A&P                      34 year old male presenting to the emergency department following an episode of altered mental status at home.  He recalls having a headache and feeling dizzy.  Subsequently was reported to have a syncopal event.  Currently states that he is feeling better.  Did discuss with patient the plan for laboratory work-up.  Initially agreeable, but subsequently decided that he no longer wanted to remain  in the department.  Given pending labs, patient opted to depart the ED AGAINST MEDICAL ADVICE.  Patient with a capacity to make this decision.  He was seen ambulating out of the department in stable condition.   Final Clinical Impression(s) / ED Diagnoses Final diagnoses:  Dizziness    Rx / DC Orders ED Discharge Orders    None       Antony Madura, PA-C 04/10/19 0323    Nira Conn, MD 04/10/19 [phone]

## 2019-12-06 IMAGING — CT CT NECK W/ CM
3 of 9 series · 8 of 33 positions shown, 9 images · IV contrast (omnipaque)
Comparison: Cervical spine CT 01/24/2005

CLINICAL DATA: Sore throat over the last 3 days.

EXAM:
CT NECK WITH CONTRAST
TECHNIQUE: Multidetector CT imaging of the neck was performed using the
standard protocol following the bolus administration of intravenous
contrast.
CONTRAST:  75mL OMNIPAQUE IOHEXOL 300 MG/ML  SOLN

[Series 5: orthogonal ax · axial · 0.34mm/px · z∈[-93,-93]mm · 1 of 89 slices shown, 2 images]
[im 45/89  soft-tissue]
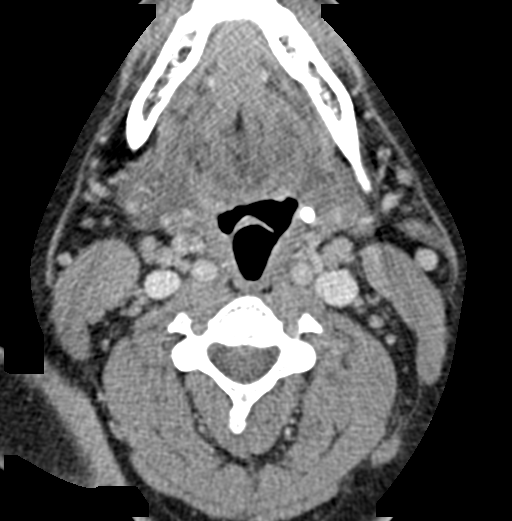
[im 45/89  bone]
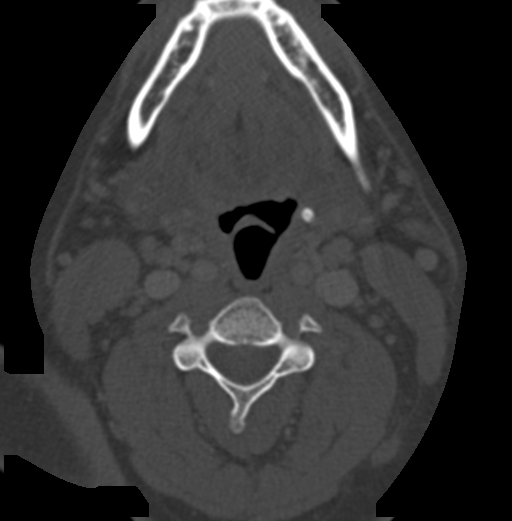

[Series 13: cor neck · coronal · 0.16mm/px · 2 of 78 slices shown]
[im 26/78  bone]
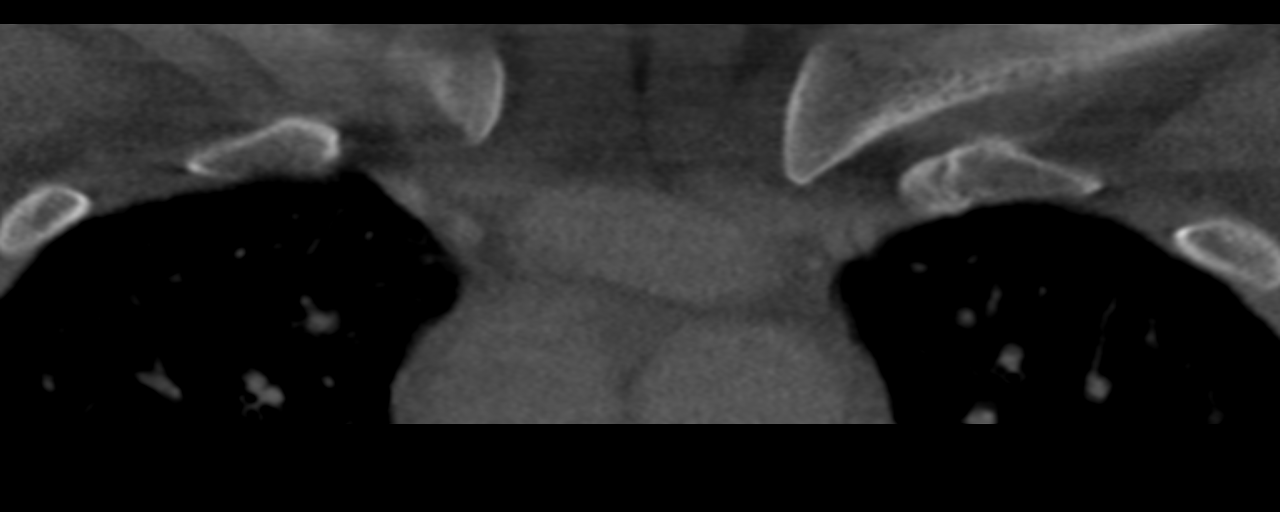
[im 52/78  bone]
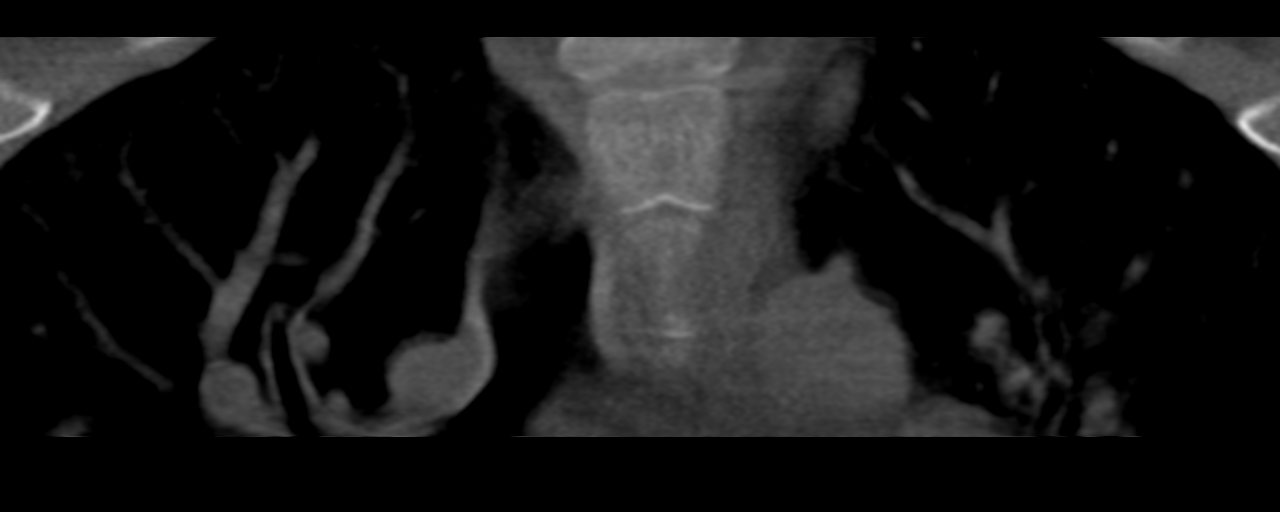

[Series 14: sag neck · sagittal · 0.15mm/px · 5 of 135 slices shown]
[im 20/135  bone]
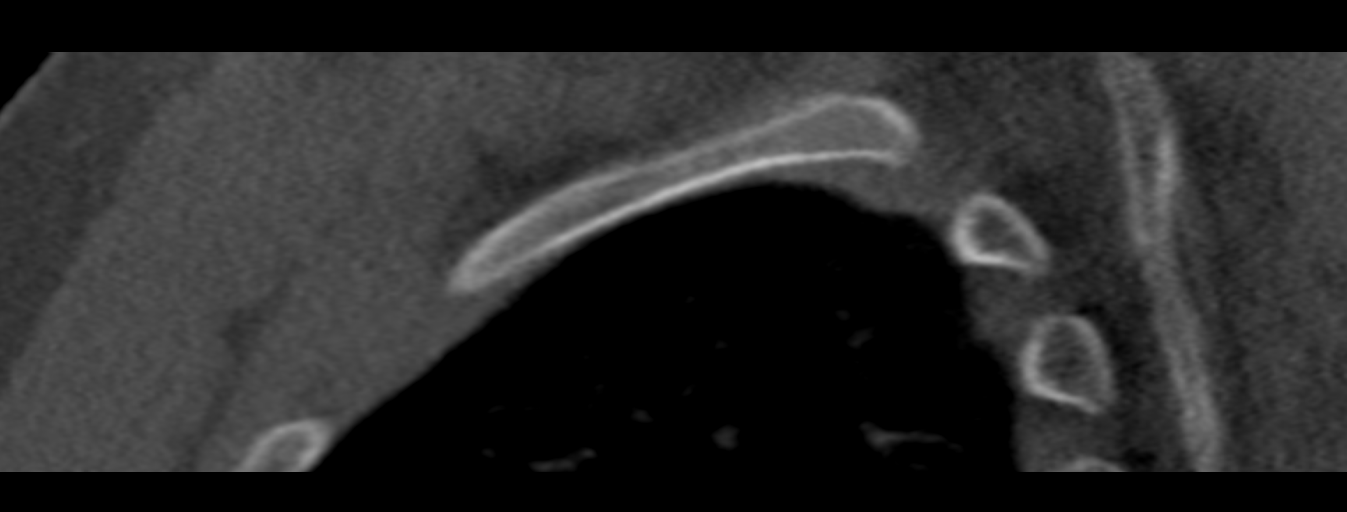
[im 39/135  bone]
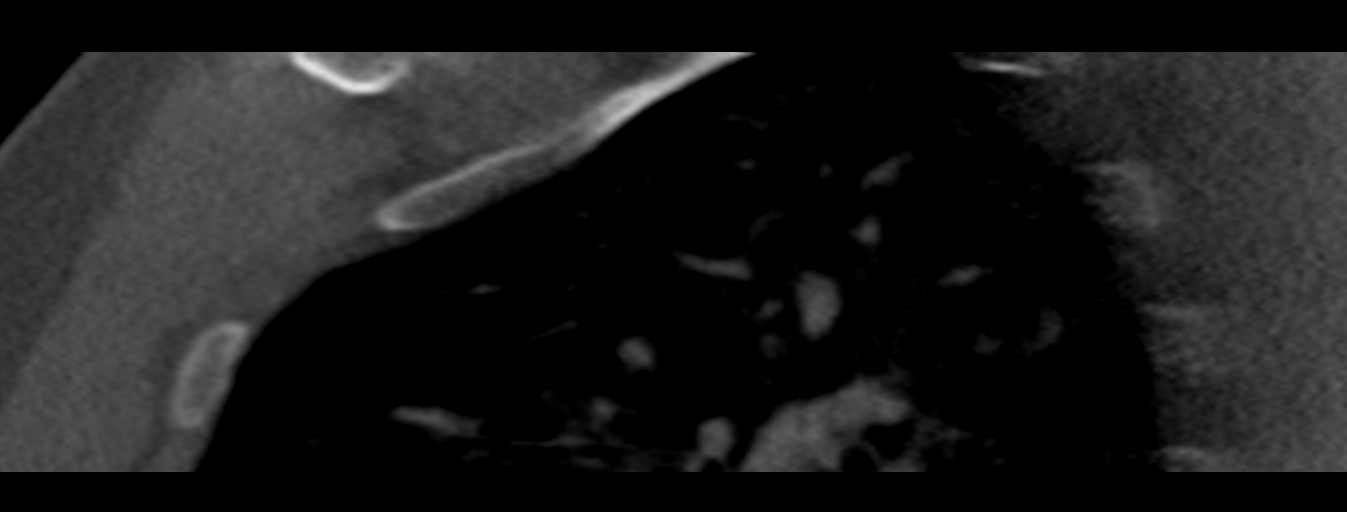
[im 58/135  bone]
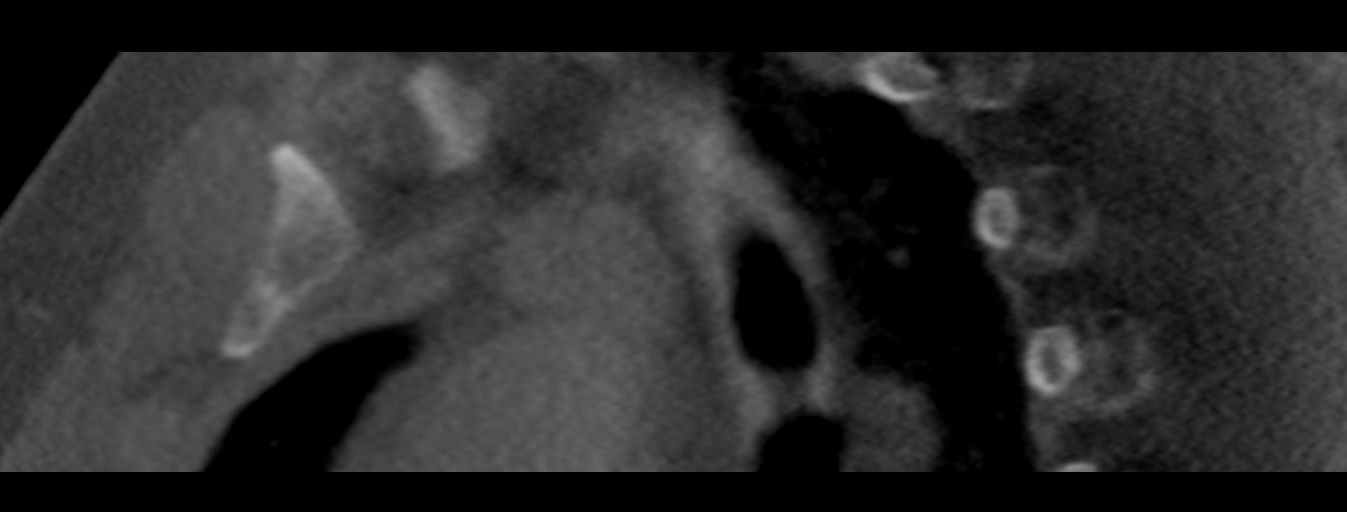
[im 77/135  bone]
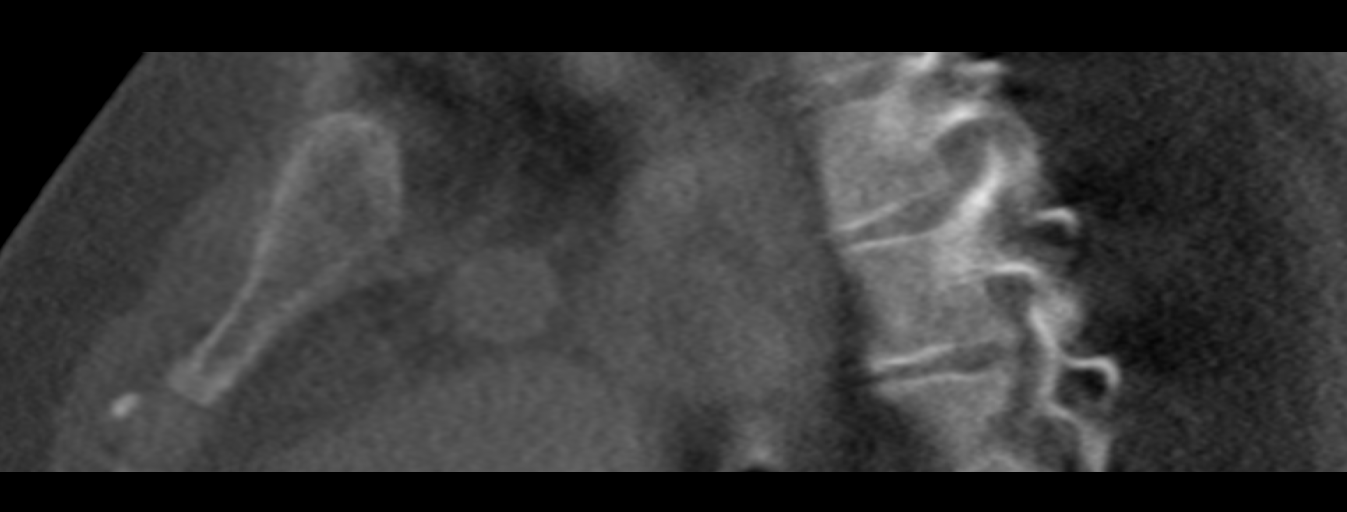
[im 96/135  bone]
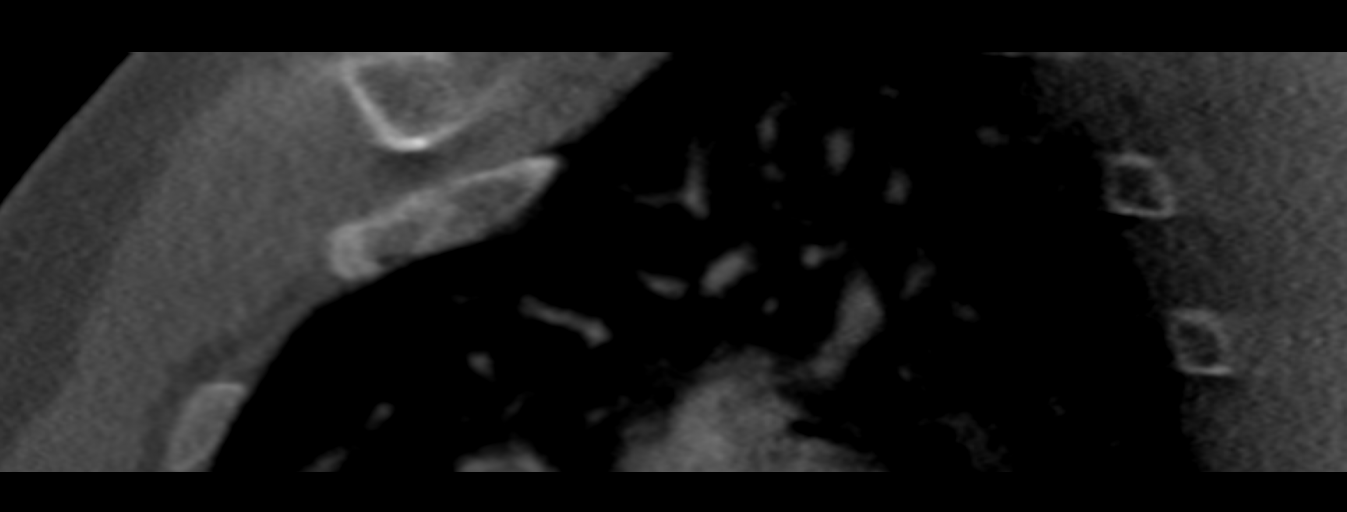

[8 of 33 positions shown; findings below may reference images not displayed]

FINDINGS: Pharynx and larynx: Pharyngeal mucosal tissue in general does appear
somewhat inflamed, but there is no asymmetry. No evidence of
tonsillar abscess.

Salivary glands: Parotid and submandibular glands are normal.

Thyroid: Normal

Lymph nodes: No enlarged or low-density nodes on either side of the
neck.

Vascular: No abnormal vascular finding.

Limited intracranial: Negative

Visualized orbits: Normal

Mastoids and visualized paranasal sinuses: Mastoids are clear.
Paranasal sinuses show areas of mucosal thickening, scattered
ethmoid opacified air cells, scattered retention cysts, and complete
opacification of the right maxillary sinus.

Skeleton: Negative

Upper chest: Negative

Other: None
IMPRESSION: Question pharyngitis, but without evidence of abscess or asymmetric
process.

## 2021-02-02 ENCOUNTER — Ambulatory Visit
Admission: EM | Admit: 2021-02-02 | Discharge: 2021-02-02 | Disposition: A | Discharge status: Home / Self Care | Payer: Self-pay | Attending: Physician Assistant | Admitting: Physician Assistant

## 2021-02-02 ENCOUNTER — Other Ambulatory Visit: Payer: Self-pay

## 2021-02-02 DIAGNOSIS — L309 Dermatitis, unspecified: Secondary | ICD-10-CM

## 2021-02-02 MED ORDER — PREDNISONE 20 MG PO TABS: 40.0000 mg | ORAL_TABLET | Freq: Every day | ORAL | 0 refills | Status: AC

## 2021-02-02 MED ORDER — METHYLPREDNISOLONE SODIUM SUCC 125 MG IJ SOLR
80.0000 mg | Freq: Once | INTRAMUSCULAR | Status: AC
  Administered 2021-02-02: 80 mg via INTRAMUSCULAR

## 2021-02-02 NOTE — ED Provider Notes (Signed)
[facility]    CSN: [id number] Arrival date & time: 02/02/21  0910      History   Chief Complaint Chief Complaint  Patient presents with   Rash    HPI Willie Chavez is a 35 y.o. male.   Patient here today for evaluation of a rash to his face and bilateral forearms that started 5 days ago.  He reports that he is not sure what triggered rash and denies any known contact with poison ivy or oak.  He has not had any change in self-care products.  He has tried using Benadryl without significant relief.  He denies any trouble swallowing or shortness of breath.  The history is provided by the patient.  Rash Associated symptoms: no fever, no nausea, no shortness of breath, not vomiting and not wheezing    Past Medical History:  Diagnosis Date   Asthma    Chronic headaches    r/t sinus infections    There are no problems to display for this patient.   History reviewed. No pertinent surgical history.     Home Medications    Prior to Admission medications   Medication Sig Start Date End Date Taking? Authorizing Provider  predniSONE (DELTASONE) 20 MG tablet Take 2 tablets (40 mg total) by mouth daily with breakfast for 5 days. 02/02/21 02/07/21 Yes Tomi Bamberger, PA-C    Family History Family History  Problem Relation Age of Onset   Hypertension Father     Social History Social History   Tobacco Use   Smoking status: Never   Smokeless tobacco: Never  Vaping Use   Vaping Use: Never used  Substance Use Topics   Alcohol use: No   Drug use: No     Allergies   Patient has no known allergies.   Review of Systems Review of Systems  Constitutional:  Negative for chills and fever.  HENT:  Negative for drooling, facial swelling and trouble swallowing.   Eyes:  Negative for discharge and redness.  Respiratory:  Negative for shortness of breath and wheezing.   Gastrointestinal:  Negative for nausea and vomiting.  Skin:  Positive for rash.     Physical Exam Triage Vital Signs ED Triage Vitals [02/02/21 1006]  Enc Vitals Group     BP 118/77     Pulse Rate 76     Resp 18     Temp 97.9 F (36.6 C)     Temp Source Oral     SpO2 95 %     Weight      Height      Head Circumference      Peak Flow      Pain Score 0     Pain Loc      Pain Edu?      Excl. in GC?    No data found.  Updated Vital Signs BP 118/77 (BP Location: Left Arm)   Pulse 76   Temp 97.9 F (36.6 C) (Oral)   Resp 18   SpO2 95%   Physical Exam Vitals and nursing note reviewed.  Constitutional:      General: He is not in acute distress.    Appearance: Normal appearance. He is not ill-appearing.  HENT:     Head: Normocephalic and atraumatic.     Nose: Nose normal.     Mouth/Throat:     Mouth: Mucous membranes are moist.     Pharynx: Oropharynx is clear.  Eyes:     Conjunctiva/sclera:  Conjunctivae normal.  Cardiovascular:     Rate and Rhythm: Normal rate.  Pulmonary:     Effort: Pulmonary effort is normal. No respiratory distress.  Skin:    General: Skin is warm and dry.     Findings: Rash (erythematous papular eruption to bilateral cheeks of face, bilateral forearms) present.  Neurological:     Mental Status: He is alert.  Psychiatric:        Mood and Affect: Mood normal.        Thought Content: Thought content normal.     UC Treatments / Results  Labs (all labs ordered are listed, but only abnormal results are displayed) Labs Reviewed - No data to display  EKG   Radiology No results found.  Procedures Procedures (including critical care time)  Medications Ordered in UC Medications  methylPREDNISolone sodium succinate (SOLU-MEDROL) 125 mg/2 mL injection 80 mg (80 mg Intramuscular Given 02/02/21 1059)    Initial Impression / Assessment and Plan / UC Course  I have reviewed the triage vital signs and the nursing notes.  Pertinent labs & imaging results that were available during my care of the patient were reviewed by  me and considered in my medical decision making (see chart for details).    Unknown etiology of dermatitis but will treat with steroid injection in office as well as prednisone prescription.  Recommend follow-up if no improvement or symptoms worsen anyway.  Advise calling 911 should he develop any shortness of breath or difficulty swallowing.  Final Clinical Impressions(s) / UC Diagnoses   Final diagnoses:  Dermatitis   Discharge Instructions   None    ED Prescriptions     Medication Sig Dispense Auth. Provider   predniSONE (DELTASONE) 20 MG tablet Take 2 tablets (40 mg total) by mouth daily with breakfast for 5 days. 10 tablet Tomi Bamberger, PA-C      PDMP not reviewed this encounter.   Tomi Bamberger, PA-C 02/02/21 1103

## 2021-02-02 NOTE — ED Triage Notes (Signed)
Pt c/o rash to face and bilateral wrist since Saturday. Denies a change in products. Tool benadryl with no relief.

## 2022-08-09 ENCOUNTER — Encounter (HOSPITAL_COMMUNITY): Payer: Self-pay

## 2022-08-09 ENCOUNTER — Emergency Department (HOSPITAL_BASED_OUTPATIENT_CLINIC_OR_DEPARTMENT_OTHER)
Admission: EM | Admit: 2022-08-09 | Discharge: 2022-08-10 | Disposition: A | Discharge status: Home / Self Care | Payer: BC Managed Care – PPO | Source: Home / Self Care | Attending: Emergency Medicine | Admitting: Emergency Medicine

## 2022-08-09 ENCOUNTER — Encounter (HOSPITAL_BASED_OUTPATIENT_CLINIC_OR_DEPARTMENT_OTHER): Payer: Self-pay

## 2022-08-09 ENCOUNTER — Other Ambulatory Visit: Payer: Self-pay

## 2022-08-09 ENCOUNTER — Emergency Department (HOSPITAL_COMMUNITY)
Admission: EM | Admit: 2022-08-09 | Discharge: 2022-08-09 | Discharge status: Against Medical Advice | Payer: BC Managed Care – PPO | Attending: Emergency Medicine | Admitting: Emergency Medicine

## 2022-08-09 DIAGNOSIS — R1033 Periumbilical pain: Secondary | ICD-10-CM | POA: Insufficient documentation

## 2022-08-09 DIAGNOSIS — R1084 Generalized abdominal pain: Secondary | ICD-10-CM | POA: Diagnosis present

## 2022-08-09 DIAGNOSIS — J45909 Unspecified asthma, uncomplicated: Secondary | ICD-10-CM | POA: Insufficient documentation

## 2022-08-09 DIAGNOSIS — Z5321 Procedure and treatment not carried out due to patient leaving prior to being seen by health care provider: Secondary | ICD-10-CM | POA: Diagnosis not present

## 2022-08-09 LAB — CBC WITH DIFFERENTIAL/PLATELET
Abs Immature Granulocytes: 0.04 10*3/uL (ref 0.00–0.07)
Basophils Absolute: 0.1 10*3/uL (ref 0.0–0.1)
Basophils Relative: 1 %
Eosinophils Absolute: 0.1 10*3/uL (ref 0.0–0.5)
Eosinophils Relative: 1 %
HCT: 45.6 % (ref 39.0–52.0)
Hemoglobin: 15.4 g/dL (ref 13.0–17.0)
Immature Granulocytes: 0 %
Lymphocytes Relative: 16 %
Lymphs Abs: 1.6 10*3/uL (ref 0.7–4.0)
MCH: 28.4 pg (ref 26.0–34.0)
MCHC: 33.8 g/dL (ref 30.0–36.0)
MCV: 84.1 fL (ref 80.0–100.0)
Monocytes Absolute: 0.6 10*3/uL (ref 0.1–1.0)
Monocytes Relative: 6 %
Neutro Abs: 7.4 10*3/uL (ref 1.7–7.7)
Neutrophils Relative %: 76 %
Platelets: 266 10*3/uL (ref 150–400)
RBC: 5.42 MIL/uL (ref 4.22–5.81)
RDW: 13.7 % (ref 11.5–15.5)
WBC: 9.8 10*3/uL (ref 4.0–10.5)
nRBC: 0 % (ref 0.0–0.2)

## 2022-08-09 LAB — URINALYSIS, ROUTINE W REFLEX MICROSCOPIC
Bilirubin Urine: NEGATIVE
Glucose, UA: NEGATIVE mg/dL
Hgb urine dipstick: NEGATIVE
Ketones, ur: NEGATIVE mg/dL
Nitrite: NEGATIVE
Protein, ur: NEGATIVE mg/dL
Specific Gravity, Urine: 1.014 (ref 1.005–1.030)
pH: 6 (ref 5.0–8.0)

## 2022-08-09 LAB — COMPREHENSIVE METABOLIC PANEL
ALT: 15 U/L (ref 0–44)
AST: 35 U/L (ref 15–41)
Albumin: 4.2 g/dL (ref 3.5–5.0)
Alkaline Phosphatase: 78 U/L (ref 38–126)
Anion gap: 17 — ABNORMAL HIGH (ref 5–15)
BUN: 11 mg/dL (ref 6–20)
CO2: 26 mmol/L (ref 22–32)
Calcium: 10.1 mg/dL (ref 8.9–10.3)
Chloride: 99 mmol/L (ref 98–111)
Creatinine, Ser: 1.02 mg/dL (ref 0.61–1.24)
GFR, Estimated: >60 mL/min (ref >60)
Glucose, Bld: 100 mg/dL — ABNORMAL HIGH (ref 70–99)
Potassium: 4.7 mmol/L (ref 3.5–5.1)
Sodium: 142 mmol/L (ref 135–145)
Total Bilirubin: 1.6 mg/dL — ABNORMAL HIGH (ref 0.3–1.2)
Total Protein: 7.5 g/dL (ref 6.5–8.1)

## 2022-08-09 LAB — LIPASE, BLOOD: Lipase: 26 U/L (ref 11–51)

## 2022-08-09 MED ORDER — OXYCODONE-ACETAMINOPHEN 5-325 MG PO TABS
1.0000 | ORAL_TABLET | Freq: Once | ORAL | Status: AC
  Administered 2022-08-09: 1 via ORAL
  Filled 2022-08-09: qty 1

## 2022-08-09 NOTE — ED Triage Notes (Signed)
Patient here POV from Home.  Endorses since 1500, lower abd pain. No N/V/D. No Known fevers.   Went to ED today but LWBS due to wait.   NAD Noted during Triage. A&Ox4. Gcs 15. Ambulatory.

## 2022-08-09 NOTE — ED Provider Triage Note (Signed)
Emergency Medicine Provider Triage Evaluation Note  Willie Chavez , a 37 y.o. male  was evaluated in triage.  Pt complains of generalized abdominal pain onset 1 hour ago, sudden onset, not associated with n/v/fevers/chills/changes in bowel or bladder habits. Prior surgery for pyloric stenosis  Review of Systems  Positive:  Negative:   Physical Exam  BP 126/86 (BP Location: Right Arm)   Pulse 73   Temp 98.4 F (36.9 C)   Resp 16   Ht 6' (1.829 m)   Wt 121.1 kg   SpO2 97%   BMI 36.21 kg/m  Gen:   Awake, no distress   Resp:  Normal effort  MSK:   Moves extremities without difficulty  Other:  Abd soft and non tender  Medical Decision Making  Medically screening exam initiated at 4:24 PM.  Appropriate orders placed.  Dolan Amen was informed that the remainder of the evaluation will be completed by another provider, this initial triage assessment does not replace that evaluation, and the importance of remaining in the ED until their evaluation is complete.     Jeannie Fend, PA-C 08/09/22 1625

## 2022-08-09 NOTE — ED Triage Notes (Signed)
Pt c/o generalized abd pain x 1 hour; denies N/V/fevers; LBM this am; denies dysuria; denies testicular pain

## 2022-08-09 NOTE — ED Notes (Signed)
Pt left ama due to long ED wait times.  

## 2022-08-10 ENCOUNTER — Emergency Department (HOSPITAL_BASED_OUTPATIENT_CLINIC_OR_DEPARTMENT_OTHER): Payer: BC Managed Care – PPO

## 2022-08-10 DIAGNOSIS — R1033 Periumbilical pain: Secondary | ICD-10-CM | POA: Diagnosis not present

## 2022-08-10 LAB — COMPREHENSIVE METABOLIC PANEL
ALT: 11 U/L (ref 0–44)
AST: 20 U/L (ref 15–41)
Albumin: 4.2 g/dL (ref 3.5–5.0)
Alkaline Phosphatase: 66 U/L (ref 38–126)
Anion gap: 8 (ref 5–15)
BUN: 13 mg/dL (ref 6–20)
CO2: 26 mmol/L (ref 22–32)
Calcium: 9.3 mg/dL (ref 8.9–10.3)
Chloride: 108 mmol/L (ref 98–111)
Creatinine, Ser: 0.86 mg/dL (ref 0.61–1.24)
GFR, Estimated: >60 mL/min (ref >60)
Glucose, Bld: 90 mg/dL (ref 70–99)
Potassium: 4.4 mmol/L (ref 3.5–5.1)
Sodium: 142 mmol/L (ref 135–145)
Total Bilirubin: 0.7 mg/dL (ref 0.3–1.2)
Total Protein: 7.4 g/dL (ref 6.5–8.1)

## 2022-08-10 LAB — CBC WITH DIFFERENTIAL/PLATELET
Abs Immature Granulocytes: 0.15 10*3/uL — ABNORMAL HIGH (ref 0.00–0.07)
Basophils Absolute: 0.1 10*3/uL (ref 0.0–0.1)
Basophils Relative: 1 %
Eosinophils Absolute: 0.1 10*3/uL (ref 0.0–0.5)
Eosinophils Relative: 1 %
HCT: 41.9 % (ref 39.0–52.0)
Hemoglobin: 14.3 g/dL (ref 13.0–17.0)
Immature Granulocytes: 1 %
Lymphocytes Relative: 19 %
Lymphs Abs: 2 10*3/uL (ref 0.7–4.0)
MCH: 28 pg (ref 26.0–34.0)
MCHC: 34.1 g/dL (ref 30.0–36.0)
MCV: 82 fL (ref 80.0–100.0)
Monocytes Absolute: 0.5 10*3/uL (ref 0.1–1.0)
Monocytes Relative: 5 %
Neutro Abs: 7.8 10*3/uL — ABNORMAL HIGH (ref 1.7–7.7)
Neutrophils Relative %: 73 %
Platelets: 228 10*3/uL (ref 150–400)
RBC: 5.11 MIL/uL (ref 4.22–5.81)
RDW: 13.8 % (ref 11.5–15.5)
WBC: 10.7 10*3/uL — ABNORMAL HIGH (ref 4.0–10.5)
nRBC: 0 % (ref 0.0–0.2)

## 2022-08-10 LAB — URINALYSIS, ROUTINE W REFLEX MICROSCOPIC
Bilirubin Urine: NEGATIVE
Glucose, UA: NEGATIVE mg/dL
Hgb urine dipstick: NEGATIVE
Ketones, ur: NEGATIVE mg/dL
Leukocytes,Ua: NEGATIVE
Nitrite: NEGATIVE
Protein, ur: NEGATIVE mg/dL
Specific Gravity, Urine: 1.021 (ref 1.005–1.030)
pH: 6.5 (ref 5.0–8.0)

## 2022-08-10 LAB — LIPASE, BLOOD: Lipase: <10 U/L — ABNORMAL LOW (ref 11–51)

## 2022-08-10 MED ORDER — IOHEXOL 300 MG/ML  SOLN
100.0000 mL | Freq: Once | INTRAMUSCULAR | Status: AC | PRN
  Administered 2022-08-10: 100 mL via INTRAVENOUS

## 2022-08-10 MED ORDER — KETOROLAC TROMETHAMINE 30 MG/ML IJ SOLN
30.0000 mg | Freq: Once | INTRAMUSCULAR | Status: AC
  Administered 2022-08-10: 30 mg via INTRAVENOUS
  Filled 2022-08-10: qty 1

## 2022-08-10 MED ORDER — ONDANSETRON HCL 4 MG/2ML IJ SOLN
4.0000 mg | Freq: Once | INTRAMUSCULAR | Status: AC
  Administered 2022-08-10: 4 mg via INTRAVENOUS
  Filled 2022-08-10: qty 2

## 2022-08-10 MED ORDER — SODIUM CHLORIDE 0.9 % IV BOLUS
1000.0000 mL | Freq: Once | INTRAVENOUS | Status: AC
  Administered 2022-08-10: 1000 mL via INTRAVENOUS

## 2022-08-10 MED ORDER — MORPHINE SULFATE (PF) 4 MG/ML IV SOLN
4.0000 mg | Freq: Once | INTRAVENOUS | Status: AC
  Administered 2022-08-10: 4 mg via INTRAVENOUS
  Filled 2022-08-10: qty 1

## 2022-08-10 NOTE — Discharge Instructions (Signed)
Take ibuprofen 600 mg every 6 hours as needed for pain.  Follow-up with your primary doctor in the next week if symptoms are not resolved.

## 2022-08-10 NOTE — ED Provider Notes (Signed)
[place] EMERGENCY DEPARTMENT AT [facility] Provider Note   CSN: [id number] Arrival date & time: 08/09/22  2233     History  Chief Complaint  Patient presents with   Abdominal Pain    DUVAL MACLEOD is a 37 y.o. male.  Patient is a 37 year old male with past medical history of asthma.  Patient presenting today with complaints of abdominal pain.  This started earlier this afternoon in the absence of any injury or trauma.  He describes severe, crampy pain that comes and goes and is located in the center of his abdomen.  He denies any nausea, vomiting, or diarrhea.  He denies any constipation.  No urinary complaints.  No fevers or chills.  He denies ill contacts or having consumed any undercooked or suspicious foods.  Pain worse with movement and no alleviating factors.  The history is provided by the patient.       Home Medications Prior to Admission medications   Not on File      Allergies    Patient has no known allergies.    Review of Systems   Review of Systems  All other systems reviewed and are negative.   Physical Exam Updated Vital Signs BP (!) 128/90 (BP Location: Left Arm)   Pulse (!) 56   Temp 98.7 F (37.1 C)   Resp 18   Ht 6' (1.829 m)   Wt 121.1 kg   SpO2 100%   BMI 36.21 kg/m  Physical Exam Vitals and nursing note reviewed.  Constitutional:      General: He is not in acute distress.    Appearance: He is well-developed. He is not diaphoretic.  HENT:     Head: Normocephalic and atraumatic.  Cardiovascular:     Rate and Rhythm: Normal rate and regular rhythm.     Heart sounds: No murmur heard.    No friction rub.  Pulmonary:     Effort: Pulmonary effort is normal. No respiratory distress.     Breath sounds: Normal breath sounds. No wheezing or rales.  Abdominal:     General: Bowel sounds are normal. There is no distension.     Palpations: Abdomen is soft.     Tenderness: There is abdominal tenderness in the periumbilical area.  There is no right CVA tenderness, left CVA tenderness, guarding or rebound.  Musculoskeletal:        General: Normal range of motion.     Cervical back: Normal range of motion and neck supple.  Skin:    General: Skin is warm and dry.  Neurological:     Mental Status: He is alert and oriented to person, place, and time.     Coordination: Coordination normal.     ED Results / Procedures / Treatments   Labs (all labs ordered are listed, but only abnormal results are displayed) Labs Reviewed - No data to display  EKG None  Radiology No results found.  Procedures Procedures    Medications Ordered in ED Medications  ondansetron (ZOFRAN) injection 4 mg (has no administration in time range)  sodium chloride 0.9 % bolus 1,000 mL (has no administration in time range)  ketorolac (TORADOL) 30 MG/ML injection 30 mg (has no administration in time range)  morphine (PF) 4 MG/ML injection 4 mg (has no administration in time range)    ED Course/ Medical Decision Making/ A&P  Patient is a 37 year old male presenting with complaints of abdominal pain as described in the HPI.  He arrives here with stable  vital signs and is afebrile.  There is tenderness to palpation in the periumbilical region, but no peritoneal signs.  Workup initiated including CBC, CMP, and lipase.  All studies unremarkable with the exception of a slight white count of 10.7.  Urinalysis is clear.  CT scan of the abdomen and pelvis obtained showing no acute intra-abdominal pathology.  Patient has been given Toradol and morphine for pain along with Zofran for nausea and seems to be feeling better.  He is also received IV fluids.  Cause of the patient's abdominal pain unclear, but nothing appears emergent.  Patient to be discharged with rest and as needed return.  Final Clinical Impression(s) / ED Diagnoses Final diagnoses:  None    Rx / DC Orders ED Discharge Orders     None         Geoffery Lyons,  MD 08/10/22 [id number]

## 2022-08-10 NOTE — ED Notes (Signed)
Patient transported to CT 

## 2022-09-28 ENCOUNTER — Emergency Department (HOSPITAL_BASED_OUTPATIENT_CLINIC_OR_DEPARTMENT_OTHER)
Admission: EM | Admit: 2022-09-28 | Discharge: 2022-09-29 | Disposition: A | Discharge status: Home / Self Care | Payer: BC Managed Care – PPO | Attending: Emergency Medicine | Admitting: Emergency Medicine

## 2022-09-28 ENCOUNTER — Encounter (HOSPITAL_BASED_OUTPATIENT_CLINIC_OR_DEPARTMENT_OTHER): Payer: Self-pay

## 2022-09-28 ENCOUNTER — Emergency Department (HOSPITAL_BASED_OUTPATIENT_CLINIC_OR_DEPARTMENT_OTHER): Payer: BC Managed Care – PPO

## 2022-09-28 ENCOUNTER — Other Ambulatory Visit: Payer: Self-pay

## 2022-09-28 DIAGNOSIS — R002 Palpitations: Secondary | ICD-10-CM | POA: Diagnosis not present

## 2022-09-28 DIAGNOSIS — R200 Anesthesia of skin: Secondary | ICD-10-CM | POA: Insufficient documentation

## 2022-09-28 LAB — TROPONIN I (HIGH SENSITIVITY)
Troponin I (High Sensitivity): 2 ng/L (ref <18)
Troponin I (High Sensitivity): 2 ng/L (ref <18)

## 2022-09-28 LAB — BASIC METABOLIC PANEL
Anion gap: 11 (ref 5–15)
BUN: 16 mg/dL (ref 6–20)
CO2: 26 mmol/L (ref 22–32)
Calcium: 9.7 mg/dL (ref 8.9–10.3)
Chloride: 105 mmol/L (ref 98–111)
Creatinine, Ser: 1.04 mg/dL (ref 0.61–1.24)
GFR, Estimated: >60 mL/min (ref >60)
Glucose, Bld: 99 mg/dL (ref 70–99)
Potassium: 3.8 mmol/L (ref 3.5–5.1)
Sodium: 142 mmol/L (ref 135–145)

## 2022-09-28 LAB — CBC
HCT: 44 % (ref 39.0–52.0)
Hemoglobin: 15.2 g/dL (ref 13.0–17.0)
MCH: 27.8 pg (ref 26.0–34.0)
MCHC: 34.5 g/dL (ref 30.0–36.0)
MCV: 80.6 fL (ref 80.0–100.0)
Platelets: 291 10*3/uL (ref 150–400)
RBC: 5.46 MIL/uL (ref 4.22–5.81)
RDW: 13.1 % (ref 11.5–15.5)
WBC: 12.4 10*3/uL — ABNORMAL HIGH (ref 4.0–10.5)
nRBC: 0 % (ref 0.0–0.2)

## 2022-09-28 NOTE — ED Provider Notes (Signed)
[place] EMERGENCY DEPARTMENT AT [facility] Provider Note   CSN: [id number] Arrival date & time: 09/28/22  2022     History  Chief Complaint  Patient presents with   Palpitations    Willie Chavez is a 37 y.o. male.  Patient is a 37 year old male presenting with complaints of palpitations and left arm numbness.  Since earlier this afternoon, he has been experiencing episodes of feeling his heart skip a beat.  He feels a slight twinge of discomfort when this occurs in the center of his chest, but denies any sustained chest discomfort.  He also describes intermittent episodes of his left arm feeling numb.  This has been coming and going for for several years.  He denies any weakness no shortness of breath, nausea, fevers, or chills.  He denies any recent exertional symptoms.  The history is provided by the patient.       Home Medications Prior to Admission medications   Not on File      Allergies    Patient has no known allergies.    Review of Systems   Review of Systems  All other systems reviewed and are negative.   Physical Exam Updated Vital Signs BP 124/80   Pulse 68   Temp 98 F (36.7 C) (Oral)   Resp 18   Ht 6' (1.829 m)   Wt 126.1 kg   SpO2 96%   BMI 37.70 kg/m  Physical Exam Vitals and nursing note reviewed.  Constitutional:      General: He is not in acute distress.    Appearance: He is well-developed. He is not diaphoretic.  HENT:     Head: Normocephalic and atraumatic.  Cardiovascular:     Rate and Rhythm: Normal rate and regular rhythm.     Heart sounds: No murmur heard.    No friction rub.  Pulmonary:     Effort: Pulmonary effort is normal. No respiratory distress.     Breath sounds: Normal breath sounds. No wheezing or rales.  Abdominal:     General: Bowel sounds are normal. There is no distension.     Palpations: Abdomen is soft.     Tenderness: There is no abdominal tenderness.  Musculoskeletal:        General: Normal  range of motion.     Cervical back: Normal range of motion and neck supple.  Skin:    General: Skin is warm and dry.  Neurological:     Mental Status: He is alert and oriented to person, place, and time.     Coordination: Coordination normal.     ED Results / Procedures / Treatments   Labs (all labs ordered are listed, but only abnormal results are displayed) Labs Reviewed  CBC - Abnormal; Notable for the following components:      Result Value   WBC 12.4 (*)    All other components within normal limits  BASIC METABOLIC PANEL  TROPONIN I (HIGH SENSITIVITY)  TROPONIN I (HIGH SENSITIVITY)    EKG ED ECG REPORT   Date: 09/28/2022  Rate: 63  Rhythm: normal sinus rhythm  QRS Axis: normal  Intervals: normal  ST/T Wave abnormalities: normal  Conduction Disutrbances:none  Narrative Interpretation:   Old EKG Reviewed: none available  I have personally reviewed the EKG tracing and agree with the computerized printout as noted.   Radiology DG Chest Portable 1 View  Result Date: 09/28/2022 CLINICAL DATA:  Left chest pain EXAM: PORTABLE CHEST 1 VIEW COMPARISON:  09/21/2016 FINDINGS:  The heart size and mediastinal contours are within normal limits. Both lungs are clear. Left hemidiaphragm is elevated. The visualized skeletal structures are unremarkable. IMPRESSION: No active cardiopulmonary disease. Electronically Signed   By: Ernie Avena M.D.   On: 09/28/2022 20:58    Procedures Procedures    Medications Ordered in ED Medications - No data to display  ED Course/ Medical Decision Making/ A&P  Patient presenting here with complaints of palpitations and left arm numbness as described in the HPI.  He arrives here with stable vital signs and physical examination which is unremarkable.  He is afebrile with no hypoxia.  Workup initiated including CBC, metabolic panel, and troponin x 2, all of which are unremarkable.  Chest x-ray shows no acute process.  I see no  indication for admission.  I highly doubt a cardiac etiology to the left arm discomfort.  I have witnessed no ectopy that would explain his palpitations, but will refer him to cardiology for Holter monitor if symptoms persist.  Patient also advised against energy drinks as he does report drinking a red bull prior to coming here.  Final Clinical Impression(s) / ED Diagnoses Final diagnoses:  None    Rx / DC Orders ED Discharge Orders     None         Geoffery Lyons, MD 09/29/22 0003

## 2022-09-28 NOTE — ED Triage Notes (Signed)
POV from home, A&O x 4, GCS 15, amb to triage  Pt sts he feels a tingling/shooting pain on left arm and left rib area also feels like "heart is skipping a beat", feels like he is panicking before these occurrences. Sts that this has been on and off for two years.

## 2022-09-29 NOTE — Discharge Instructions (Addendum)
Avoid energy drinks and excessive caffeine.  Follow-up with cardiology if your symptoms persist.  The contact information for the cardiology clinic on [facility] has been provided in this discharge summary for you to call and make these arrangements.  Return to the ER if symptoms significantly worsen or change.

## 2022-09-29 NOTE — ED Notes (Signed)
All appropriate discharge materials reviewed at length with patient. Time for questions provided. Pt has no other questions at this time and verbalizes understanding of all provided materials.  

## 2022-10-12 ENCOUNTER — Encounter (HOSPITAL_BASED_OUTPATIENT_CLINIC_OR_DEPARTMENT_OTHER): Payer: Self-pay
